# Patient Record
Sex: Female | Born: 1960 | Hispanic: No | Marital: Married | Smoking: Never smoker
Health system: Southern US, Community
[De-identification: ages and names within clinical notes are randomized; demographics above are authoritative.]

## PROBLEM LIST (undated history)

## (undated) DIAGNOSIS — D649 Anemia, unspecified: Secondary | ICD-10-CM

## (undated) DIAGNOSIS — I10 Essential (primary) hypertension: Secondary | ICD-10-CM

## (undated) DIAGNOSIS — K219 Gastro-esophageal reflux disease without esophagitis: Secondary | ICD-10-CM

## (undated) DIAGNOSIS — T7840XA Allergy, unspecified, initial encounter: Secondary | ICD-10-CM

## (undated) DIAGNOSIS — E119 Type 2 diabetes mellitus without complications: Secondary | ICD-10-CM

## (undated) DIAGNOSIS — E785 Hyperlipidemia, unspecified: Secondary | ICD-10-CM

## (undated) HISTORY — DX: Hyperlipidemia, unspecified: E78.5

## (undated) HISTORY — DX: Type 2 diabetes mellitus without complications: E11.9

## (undated) HISTORY — DX: Essential (primary) hypertension: I10

## (undated) HISTORY — DX: Anemia, unspecified: D64.9

## (undated) HISTORY — DX: Gastro-esophageal reflux disease without esophagitis: K21.9

## (undated) HISTORY — DX: Allergy, unspecified, initial encounter: T78.40XA

---

## 2005-07-05 ENCOUNTER — Ambulatory Visit (HOSPITAL_COMMUNITY): Admission: RE | Admit: 2005-07-05 | Discharge: 2005-07-05 | Payer: Self-pay | Admitting: Family Medicine

## 2007-02-09 ENCOUNTER — Other Ambulatory Visit: Admission: RE | Admit: 2007-02-09 | Discharge: 2007-02-09 | Payer: Self-pay | Admitting: Family Medicine

## 2008-12-05 ENCOUNTER — Encounter (INDEPENDENT_AMBULATORY_CARE_PROVIDER_SITE_OTHER): Payer: Self-pay | Admitting: Orthopedic Surgery

## 2008-12-05 ENCOUNTER — Ambulatory Visit (HOSPITAL_BASED_OUTPATIENT_CLINIC_OR_DEPARTMENT_OTHER): Admission: RE | Admit: 2008-12-05 | Discharge: 2008-12-05 | Payer: Self-pay | Admitting: Orthopedic Surgery

## 2010-09-10 ENCOUNTER — Ambulatory Visit (HOSPITAL_COMMUNITY)
Admission: RE | Admit: 2010-09-10 | Discharge: 2010-09-10 | Payer: Self-pay | Source: Home / Self Care | Attending: Obstetrics & Gynecology | Admitting: Obstetrics & Gynecology

## 2010-09-23 ENCOUNTER — Encounter
Admission: RE | Admit: 2010-09-23 | Discharge: 2010-09-23 | Payer: Self-pay | Source: Home / Self Care | Attending: Allergy | Admitting: Allergy

## 2010-12-23 LAB — POCT HEMOGLOBIN-HEMACUE: Hemoglobin: 14.7 g/dL (ref 12.0–15.0)

## 2011-01-25 NOTE — Op Note (Signed)
Bianca Robinson, Bianca Robinson                ACCOUNT NO.:  [id number]   MEDICAL RECORD NO.:  [id number]          PATIENT TYPE:  AMB   LOCATION:  DSC                          FACILITY:  [facility]   PHYSICIAN:  Matthew A. Weingold, M.D.DATE OF BIRTH:  03-Oct-1960   DATE OF PROCEDURE:  12/05/2008  DATE OF DISCHARGE:                               OPERATIVE REPORT   PREOPERATIVE DIAGNOSIS:  Right thumb thenar mass.   POSTOPERATIVE DIAGNOSIS:  Right thumb thenar mass.   PROCEDURE:  Excisional biopsy, right thumb thenar mass.   SURGEON:  Artist Pais. Mina Marble, MD   ASSISTANT:  None.   ANESTHESIA:  General.   TOURNIQUET TIME:  20 minutes.   COMPLICATIONS:  None.   DRAINS:  None.   SPECIMEN:  1 specimen sent.   OPERATIVE REPORT:  The patient was taken to the operating suite.  After  induction of adequate general anesthesia, the right upper extremity was  prepped and draped in sterile fashion.  Esmarch was used to exsanguinate  the limb tourniquet and inflated to 275 mmHg.  At this point in time,  longitudinal incision was made midline of the thenar eminence to the MP  flexion crease and the base thumb metacarpal.  Skin was incised 3-4 cm.  Dissection was carried down to a mass that appeared to be consistent  with a probable hemangioma.  This was dissected down to the level of the  neurovascular bundles, both of which were identified and retracted.  The  mass was removed from its entirety off the fascia overlying the thenar  muscles and was sent to pathologic confirmation.  The wound was  irrigated.  Hemostasis was achieved with electrocautery.  It was loosely  closed with 3-0 Prolene subcuticular stitch.  Steri-Strips, 4x4s,  fluffs, and compressed dressing was applied.  The patient tolerated the  procedure well and taken to recovery in stable fashion.      Artist Pais Mina Marble, M.D.  Electronically Signed     MAW/MEDQ  D:  12/05/2008  T:  12/05/2008  Job:  [id number]

## 2012-02-16 ENCOUNTER — Other Ambulatory Visit: Payer: Self-pay | Admitting: Obstetrics & Gynecology

## 2012-02-16 DIAGNOSIS — Z1231 Encounter for screening mammogram for malignant neoplasm of breast: Secondary | ICD-10-CM

## 2012-03-12 ENCOUNTER — Ambulatory Visit (HOSPITAL_COMMUNITY)
Admission: RE | Admit: 2012-03-12 | Discharge: 2012-03-12 | Disposition: A | Discharge status: Home / Self Care | Payer: Commercial Managed Care - PPO | Source: Ambulatory Visit | Attending: Obstetrics & Gynecology | Admitting: Obstetrics & Gynecology

## 2012-03-12 DIAGNOSIS — Z1231 Encounter for screening mammogram for malignant neoplasm of breast: Secondary | ICD-10-CM

## 2012-09-12 HISTORY — PX: HAND SURGERY: SHX662

## 2014-04-09 ENCOUNTER — Encounter (HOSPITAL_COMMUNITY): Payer: Self-pay | Admitting: Emergency Medicine

## 2014-04-09 ENCOUNTER — Emergency Department (HOSPITAL_COMMUNITY)
Admission: EM | Admit: 2014-04-09 | Discharge: 2014-04-09 | Disposition: A | Discharge status: Home / Self Care | Payer: 59 | Attending: Emergency Medicine | Admitting: Emergency Medicine

## 2014-04-09 ENCOUNTER — Emergency Department (HOSPITAL_COMMUNITY): Payer: 59

## 2014-04-09 DIAGNOSIS — S8990XA Unspecified injury of unspecified lower leg, initial encounter: Secondary | ICD-10-CM | POA: Insufficient documentation

## 2014-04-09 DIAGNOSIS — W010XXA Fall on same level from slipping, tripping and stumbling without subsequent striking against object, initial encounter: Secondary | ICD-10-CM | POA: Insufficient documentation

## 2014-04-09 DIAGNOSIS — IMO0002 Reserved for concepts with insufficient information to code with codable children: Secondary | ICD-10-CM | POA: Insufficient documentation

## 2014-04-09 DIAGNOSIS — S8391XA Sprain of unspecified site of right knee, initial encounter: Secondary | ICD-10-CM

## 2014-04-09 DIAGNOSIS — S93409A Sprain of unspecified ligament of unspecified ankle, initial encounter: Secondary | ICD-10-CM | POA: Insufficient documentation

## 2014-04-09 DIAGNOSIS — S99929A Unspecified injury of unspecified foot, initial encounter: Secondary | ICD-10-CM

## 2014-04-09 DIAGNOSIS — S99919A Unspecified injury of unspecified ankle, initial encounter: Secondary | ICD-10-CM

## 2014-04-09 DIAGNOSIS — Y9301 Activity, walking, marching and hiking: Secondary | ICD-10-CM | POA: Insufficient documentation

## 2014-04-09 DIAGNOSIS — Y929 Unspecified place or not applicable: Secondary | ICD-10-CM | POA: Insufficient documentation

## 2014-04-09 DIAGNOSIS — S93401A Sprain of unspecified ligament of right ankle, initial encounter: Secondary | ICD-10-CM

## 2014-04-09 DIAGNOSIS — Z791 Long term (current) use of non-steroidal anti-inflammatories (NSAID): Secondary | ICD-10-CM | POA: Insufficient documentation

## 2014-04-09 MED ORDER — NAPROXEN 500 MG PO TABS: 500.0000 mg | ORAL_TABLET | Freq: Two times a day (BID) | ORAL | Status: AC

## 2014-04-09 MED ORDER — HYDROCODONE-ACETAMINOPHEN 5-325 MG PO TABS
2.0000 | ORAL_TABLET | Freq: Once | ORAL | Status: AC
  Administered 2014-04-09: 2 via ORAL
  Filled 2014-04-09: qty 2

## 2014-04-09 MED ORDER — ONDANSETRON HCL 4 MG/2ML IJ SOLN
4.0000 mg | Freq: Once | INTRAMUSCULAR | Status: AC
Start: 2014-04-09 — End: 2014-04-09
  Administered 2014-04-09: 4 mg via INTRAVENOUS

## 2014-04-09 MED ORDER — HYDROCODONE-ACETAMINOPHEN 5-325 MG PO TABS: 2.0000 | ORAL_TABLET | ORAL | Status: AC | PRN

## 2014-04-09 MED ORDER — FENTANYL CITRATE 0.05 MG/ML IJ SOLN
50.0000 ug | Freq: Once | INTRAMUSCULAR | Status: AC
  Administered 2014-04-09: 50 ug via INTRAVENOUS
  Filled 2014-04-09: qty 2

## 2014-04-09 MED ORDER — DOCUSATE SODIUM 100 MG PO CAPS: 100.0000 mg | ORAL_CAPSULE | Freq: Two times a day (BID) | ORAL | Status: AC

## 2014-04-09 MED ORDER — ONDANSETRON HCL 4 MG/2ML IJ SOLN
INTRAMUSCULAR | Status: AC
  Administered 2014-04-09: 4 mg via INTRAVENOUS
  Filled 2014-04-09: qty 2

## 2014-04-09 NOTE — ED Notes (Signed)
c-collar and LSB cleared by Dr. Sabra Heck.  Pt A+Ox4, actively vomiting.  +bruising to R knee, pt c/o pain to RLE.  Skin otherwise PWD.  No obvious deformities.  Family at bedside.  NAD.

## 2014-04-09 NOTE — ED Notes (Signed)
Pt transported to Xray. 

## 2014-04-09 NOTE — ED Provider Notes (Signed)
CSN: [id number]     Arrival date & time 04/09/14  1837 History   First MD Initiated Contact with Patient 04/09/14 1847     Chief Complaint  Patient presents with  . Fall  . Leg Pain     (Consider location/radiation/quality/duration/timing/severity/associated sxs/prior Treatment) HPI Comments: 53 year old female with no significant past medical history presents after having a mechanical fall when she tripped over an uneven piece of concrete per the patient's report. She ended up landing on her right side including her lower extremities. She was unable to get off the ground because of ongoing pain in her mid thigh, knee and right lower extremity. She denies head injury or neck pain and has no weakness or numbness. The patient was immobilized with a backboard and cervical collar prior to arrival. The patient denies head injury. No medications given prior to arrival. The patient did have some hyperventilating during which time she had some tingling in her fingers and lips. This occurred just prior to arrival, it was acute in onset, the symptoms are persistent, worse with manipulation of the right lower extremity.  Patient is a 53 y.o. female presenting with fall and leg pain. The history is provided by the patient, the spouse and the EMS personnel.  Fall  Leg Pain   History reviewed. No pertinent past medical history. No past surgical history on file. No family history on file. History  Substance Use Topics  . Smoking status: Not on file  . Smokeless tobacco: Not on file  . Alcohol Use: Not on file   OB History   Grav Para Term Preterm Abortions TAB SAB Ect Mult Living                 Review of Systems  All other systems reviewed and are negative.     Allergies  Review of patient's allergies indicates no known allergies.  Home Medications   Prior to Admission medications   Medication Sig Start Date End Date Taking? Authorizing Provider  docusate sodium (COLACE) 100 MG  capsule Take 1 capsule (100 mg total) by mouth every 12 (twelve) hours. 04/09/14   Johnna Acosta, MD  HYDROcodone-acetaminophen (NORCO/VICODIN) 5-325 MG per tablet Take 2 tablets by mouth every 4 (four) hours as needed. 04/09/14   Johnna Acosta, MD  naproxen (NAPROSYN) 500 MG tablet Take 1 tablet (500 mg total) by mouth 2 (two) times daily with a meal. 04/09/14   Johnna Acosta, MD   BP 142/109  Pulse 99  Temp(Src) 98.3 F (36.8 C) (Oral)  Resp 16  SpO2 100%  LMP 03/10/2014 Physical Exam  Nursing note and vitals reviewed. Constitutional: She appears well-developed and well-nourished.  Uncomfortable appearing  HENT:  Head: Normocephalic and atraumatic.  Mouth/Throat: Oropharynx is clear and moist. No oropharyngeal exudate.  Eyes: Conjunctivae and EOM are normal. Pupils are equal, round, and reactive to light. Right eye exhibits no discharge. Left eye exhibits no discharge. No scleral icterus.  Neck: Normal range of motion. Neck supple. No JVD present. No thyromegaly present.  Cardiovascular: Normal rate, regular rhythm, normal heart sounds and intact distal pulses.  Exam reveals no gallop and no friction rub.   No murmur heard. Pulmonary/Chest: Effort normal and breath sounds normal. No respiratory distress. She has no wheezes. She has no rales.  Abdominal: Soft. Bowel sounds are normal. She exhibits no distension and no mass. There is no tenderness.  Musculoskeletal: Normal range of motion. She exhibits tenderness ( ttp in the mid thigh,  the knee and the RLE on the R.  mild bruising distal to the knee anteriorly, dec ROM of the knee and ankle on the R, normal hipi exam). She exhibits no edema.  Lymphadenopathy:    She has no cervical adenopathy.  Neurological: She is alert. Coordination normal.  Normal strenght and sensation to all 4 extremities - won't raise RLE off bed 2/2 pain  Skin: Skin is warm and dry.  Psychiatric: She has a normal mood and affect. Her behavior is normal.    ED  Course  Procedures (including critical care time) Labs Review Labs Reviewed - No data to display  Imaging Review Dg Femur Right  04/09/2014   CLINICAL DATA:  Fall today with right hip pain and ankle pain.  EXAM: RIGHT KNEE - COMPLETE 4+ VIEW; RIGHT ANKLE - COMPLETE 3+ VIEW; RIGHT FEMUR - 2 VIEW; RIGHT TIBIA AND FIBULA - 2 VIEW  COMPARISON:  None.  FINDINGS: Four views right knee: No acute fracture or dislocation. 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 No joint effusion. Mild enthesopathic change at the quadriceps insertion.  Four views of the right femur. Mild weight-bearing surface joint space narrowing about the hip. Nutrient foramen within the posterior mid femoral shaft. Degraded cross-table lateral view secondary to overlying soft tissues.  Four views of the right tibia/ fibula: No acute fracture or dislocation.  Three views of the right ankle: Bimalleolar soft tissue swelling. No acute fracture or dislocation. Base of fifth metatarsal and talar dome intact. Lateral view is suboptimal secondary to obliquity and cross-table technique. Apparent irregularity about the posterior aspect of the distal fibula and talus on the lateral view is favored to be due to an accessory ossicle. Small Achilles and calcaneal spurs.  IMPRESSION: Soft tissue swelling about the ankle. Suboptimal lateral view. Favor accessory ossicle about the posterior aspect of the talus and distal fibula. If symptoms in this area, consider repeat with standard lateral view.  Otherwise, degenerative change without acute osseous finding throughout the remainder of the right lower extremity.   Electronically Signed   By: Abigail Miyamoto M.D.   On: 04/09/2014 19:31   Dg Tibia/fibula Right  04/09/2014   CLINICAL DATA:  Fall today with right hip pain and ankle pain.  EXAM: RIGHT KNEE - COMPLETE 4+ VIEW; RIGHT ANKLE - COMPLETE 3+ VIEW; RIGHT FEMUR - 2 VIEW; RIGHT TIBIA AND FIBULA - 2 VIEW  COMPARISON:  None.  FINDINGS: Four  views right knee: No acute fracture or dislocation. 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 No joint effusion. Mild enthesopathic change at the quadriceps insertion.  Four views of the right femur. Mild weight-bearing surface joint space narrowing about the hip. Nutrient foramen within the posterior mid femoral shaft. Degraded cross-table lateral view secondary to overlying soft tissues.  Four views of the right tibia/ fibula: No acute fracture or dislocation.  Three views of the right ankle: Bimalleolar soft tissue swelling. No acute fracture or dislocation. Base of fifth metatarsal and talar dome intact. Lateral view is suboptimal secondary to obliquity and cross-table technique. Apparent irregularity about the posterior aspect of the distal fibula and talus on the lateral view is favored to be due to an accessory ossicle. Small Achilles and calcaneal spurs.  IMPRESSION: Soft tissue swelling about the ankle. Suboptimal lateral view. Favor accessory ossicle about the posterior aspect of the talus and distal fibula. If symptoms in this area, consider repeat with standard lateral view.  Otherwise, degenerative change without acute osseous finding throughout the  remainder of the right lower extremity.   Electronically Signed   By: Abigail Miyamoto M.D.   On: 04/09/2014 19:31   Dg Ankle Complete Right  04/09/2014   CLINICAL DATA:  Fall today with right hip pain and ankle pain.  EXAM: RIGHT KNEE - COMPLETE 4+ VIEW; RIGHT ANKLE - COMPLETE 3+ VIEW; RIGHT FEMUR - 2 VIEW; RIGHT TIBIA AND FIBULA - 2 VIEW  COMPARISON:  None.  FINDINGS: Four views right knee: No acute fracture or dislocation. 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 No joint effusion. Mild enthesopathic change at the quadriceps insertion.  Four views of the right femur. Mild weight-bearing surface joint space narrowing about the hip. Nutrient foramen within the posterior mid femoral shaft. Degraded cross-table lateral  view secondary to overlying soft tissues.  Four views of the right tibia/ fibula: No acute fracture or dislocation.  Three views of the right ankle: Bimalleolar soft tissue swelling. No acute fracture or dislocation. Base of fifth metatarsal and talar dome intact. Lateral view is suboptimal secondary to obliquity and cross-table technique. Apparent irregularity about the posterior aspect of the distal fibula and talus on the lateral view is favored to be due to an accessory ossicle. Small Achilles and calcaneal spurs.  IMPRESSION: Soft tissue swelling about the ankle. Suboptimal lateral view. Favor accessory ossicle about the posterior aspect of the talus and distal fibula. If symptoms in this area, consider repeat with standard lateral view.  Otherwise, degenerative change without acute osseous finding throughout the remainder of the right lower extremity.   Electronically Signed   By: Abigail Miyamoto M.D.   On: 04/09/2014 19:31   Dg Knee Complete 4 Views Right  04/09/2014   CLINICAL DATA:  Fall today with right hip pain and ankle pain.  EXAM: RIGHT KNEE - COMPLETE 4+ VIEW; RIGHT ANKLE - COMPLETE 3+ VIEW; RIGHT FEMUR - 2 VIEW; RIGHT TIBIA AND FIBULA - 2 VIEW  COMPARISON:  None.  FINDINGS: Four views right knee: No acute fracture or dislocation. 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 No joint effusion. Mild enthesopathic change at the quadriceps insertion.  Four views of the right femur. Mild weight-bearing surface joint space narrowing about the hip. Nutrient foramen within the posterior mid femoral shaft. Degraded cross-table lateral view secondary to overlying soft tissues.  Four views of the right tibia/ fibula: No acute fracture or dislocation.  Three views of the right ankle: Bimalleolar soft tissue swelling. No acute fracture or dislocation. Base of fifth metatarsal and talar dome intact. Lateral view is suboptimal secondary to obliquity and cross-table technique. Apparent irregularity  about the posterior aspect of the distal fibula and talus on the lateral view is favored to be due to an accessory ossicle. Small Achilles and calcaneal spurs.  IMPRESSION: Soft tissue swelling about the ankle. Suboptimal lateral view. Favor accessory ossicle about the posterior aspect of the talus and distal fibula. If symptoms in this area, consider repeat with standard lateral view.  Otherwise, degenerative change without acute osseous finding throughout the remainder of the right lower extremity.   Electronically Signed   By: Abigail Miyamoto M.D.   On: 04/09/2014 19:31      MDM   Final diagnoses:  Sprain of right knee, initial encounter  Sprain of right ankle, initial encounter    Imaging ordered, pain meds ordered, reevaluate.  No head injury, back pain or CP / SOB / Abd pain or vomiting - spouse presents and states she occasionally has a coughing fit (feels  like she has something in her throat) when she has had pain - similar to past.  Doubt primary lung or neck abnormality.  xrays neg - recommended f/u with Ortho in 7-10 days - crutches, RICE, and f/u PRN for repeat knee exam when swelling and pain down.  Pt in agreement.  Meds given in ED:  Medications  HYDROcodone-acetaminophen (NORCO/VICODIN) 5-325 MG per tablet 2 tablet (not administered)  fentaNYL (SUBLIMAZE) injection 50 mcg (50 mcg Intravenous Given 04/09/14 1909)  ondansetron (ZOFRAN) injection 4 mg (4 mg Intravenous Given 04/09/14 1855)    New Prescriptions   DOCUSATE SODIUM (COLACE) 100 MG CAPSULE    Take 1 capsule (100 mg total) by mouth every 12 (twelve) hours.   HYDROCODONE-ACETAMINOPHEN (NORCO/VICODIN) 5-325 MG PER TABLET    Take 2 tablets by mouth every 4 (four) hours as needed.   NAPROXEN (NAPROSYN) 500 MG TABLET    Take 1 tablet (500 mg total) by mouth 2 (two) times daily with a meal.      Johnna Acosta, MD 04/09/14 [phone]

## 2014-04-09 NOTE — ED Notes (Signed)
Bed: WA01 Expected date:  Expected time:  Means of arrival:  Comments: EMS- 53yo F, leg pain after fall, anxiety

## 2014-04-09 NOTE — ED Notes (Signed)
Pt was walking up stairs at [facility] and fell landing on her R side. States she now has pain in R hip and leg. Lower back pain. Anxiety. Tingling in fingers and lips after hyperventilating. Alert and oriented. No LOC.

## 2015-01-13 ENCOUNTER — Other Ambulatory Visit: Payer: Self-pay | Admitting: Physician Assistant

## 2015-01-13 ENCOUNTER — Other Ambulatory Visit (HOSPITAL_COMMUNITY)
Admission: RE | Admit: 2015-01-13 | Discharge: 2015-01-13 | Disposition: A | Discharge status: Home / Self Care | Payer: 59 | Source: Ambulatory Visit | Attending: Physician Assistant | Admitting: Physician Assistant

## 2015-01-13 DIAGNOSIS — Z01419 Encounter for gynecological examination (general) (routine) without abnormal findings: Secondary | ICD-10-CM | POA: Diagnosis present

## 2015-01-13 DIAGNOSIS — Z1151 Encounter for screening for human papillomavirus (HPV): Secondary | ICD-10-CM | POA: Insufficient documentation

## 2015-01-14 LAB — CYTOLOGY - PAP

## 2015-03-12 ENCOUNTER — Encounter: Payer: Self-pay | Admitting: Physician Assistant

## 2015-06-19 ENCOUNTER — Encounter: Payer: Self-pay | Admitting: Physician Assistant

## 2015-09-15 MED FILL — AMLODIPINE BESYLATE 10 MG T: 10 | 30 days supply | Qty: 30 | Fill #1

## 2015-09-16 DIAGNOSIS — J301 Allergic rhinitis due to pollen: Secondary | ICD-10-CM | POA: Diagnosis not present

## 2015-09-16 DIAGNOSIS — J3089 Other allergic rhinitis: Secondary | ICD-10-CM | POA: Diagnosis not present

## 2015-09-23 DIAGNOSIS — J3089 Other allergic rhinitis: Secondary | ICD-10-CM | POA: Diagnosis not present

## 2015-09-23 DIAGNOSIS — J301 Allergic rhinitis due to pollen: Secondary | ICD-10-CM | POA: Diagnosis not present

## 2015-09-24 DIAGNOSIS — K644 Residual hemorrhoidal skin tags: Secondary | ICD-10-CM | POA: Diagnosis not present

## 2015-09-24 DIAGNOSIS — R232 Flushing: Secondary | ICD-10-CM | POA: Diagnosis not present

## 2015-09-24 DIAGNOSIS — M79601 Pain in right arm: Secondary | ICD-10-CM | POA: Diagnosis not present

## 2015-09-24 DIAGNOSIS — I10 Essential (primary) hypertension: Secondary | ICD-10-CM | POA: Diagnosis not present

## 2015-09-30 DIAGNOSIS — J3089 Other allergic rhinitis: Secondary | ICD-10-CM | POA: Diagnosis not present

## 2015-09-30 DIAGNOSIS — J301 Allergic rhinitis due to pollen: Secondary | ICD-10-CM | POA: Diagnosis not present

## 2015-10-06 MED FILL — HYDROCORT-PRAMOXINE 2.5%-1%: 2.5-1 | 30 days supply | Qty: 120 | Fill #0

## 2015-10-06 MED FILL — AMLODIPINE BESYLATE 10 MG T: 10 | 90 days supply | Qty: 90 | Fill #0

## 2015-10-07 DIAGNOSIS — J3089 Other allergic rhinitis: Secondary | ICD-10-CM | POA: Diagnosis not present

## 2015-10-07 DIAGNOSIS — J301 Allergic rhinitis due to pollen: Secondary | ICD-10-CM | POA: Diagnosis not present

## 2015-10-14 DIAGNOSIS — J3089 Other allergic rhinitis: Secondary | ICD-10-CM | POA: Diagnosis not present

## 2015-10-14 DIAGNOSIS — J301 Allergic rhinitis due to pollen: Secondary | ICD-10-CM | POA: Diagnosis not present

## 2015-10-29 DIAGNOSIS — J301 Allergic rhinitis due to pollen: Secondary | ICD-10-CM | POA: Diagnosis not present

## 2015-10-29 DIAGNOSIS — J3089 Other allergic rhinitis: Secondary | ICD-10-CM | POA: Diagnosis not present

## 2015-11-04 DIAGNOSIS — J301 Allergic rhinitis due to pollen: Secondary | ICD-10-CM | POA: Diagnosis not present

## 2015-11-04 DIAGNOSIS — J3089 Other allergic rhinitis: Secondary | ICD-10-CM | POA: Diagnosis not present

## 2015-11-11 DIAGNOSIS — J3089 Other allergic rhinitis: Secondary | ICD-10-CM | POA: Diagnosis not present

## 2015-11-11 DIAGNOSIS — J301 Allergic rhinitis due to pollen: Secondary | ICD-10-CM | POA: Diagnosis not present

## 2015-11-18 DIAGNOSIS — J301 Allergic rhinitis due to pollen: Secondary | ICD-10-CM | POA: Diagnosis not present

## 2015-11-18 DIAGNOSIS — J3089 Other allergic rhinitis: Secondary | ICD-10-CM | POA: Diagnosis not present

## 2015-11-25 DIAGNOSIS — J301 Allergic rhinitis due to pollen: Secondary | ICD-10-CM | POA: Diagnosis not present

## 2015-11-25 DIAGNOSIS — J3089 Other allergic rhinitis: Secondary | ICD-10-CM | POA: Diagnosis not present

## 2015-12-02 DIAGNOSIS — J3089 Other allergic rhinitis: Secondary | ICD-10-CM | POA: Diagnosis not present

## 2015-12-02 DIAGNOSIS — J301 Allergic rhinitis due to pollen: Secondary | ICD-10-CM | POA: Diagnosis not present

## 2015-12-09 DIAGNOSIS — J301 Allergic rhinitis due to pollen: Secondary | ICD-10-CM | POA: Diagnosis not present

## 2015-12-09 DIAGNOSIS — J3089 Other allergic rhinitis: Secondary | ICD-10-CM | POA: Diagnosis not present

## 2015-12-16 DIAGNOSIS — J301 Allergic rhinitis due to pollen: Secondary | ICD-10-CM | POA: Diagnosis not present

## 2015-12-16 DIAGNOSIS — J3089 Other allergic rhinitis: Secondary | ICD-10-CM | POA: Diagnosis not present

## 2015-12-23 DIAGNOSIS — J301 Allergic rhinitis due to pollen: Secondary | ICD-10-CM | POA: Diagnosis not present

## 2015-12-23 DIAGNOSIS — J3089 Other allergic rhinitis: Secondary | ICD-10-CM | POA: Diagnosis not present

## 2015-12-30 DIAGNOSIS — J301 Allergic rhinitis due to pollen: Secondary | ICD-10-CM | POA: Diagnosis not present

## 2015-12-30 DIAGNOSIS — J3089 Other allergic rhinitis: Secondary | ICD-10-CM | POA: Diagnosis not present

## 2016-01-06 DIAGNOSIS — J3089 Other allergic rhinitis: Secondary | ICD-10-CM | POA: Diagnosis not present

## 2016-01-06 DIAGNOSIS — J301 Allergic rhinitis due to pollen: Secondary | ICD-10-CM | POA: Diagnosis not present

## 2016-01-12 MED FILL — AMLODIPINE BESYLATE 10 MG T: 10 | 90 days supply | Qty: 90 | Fill #1

## 2016-01-13 DIAGNOSIS — J3089 Other allergic rhinitis: Secondary | ICD-10-CM | POA: Diagnosis not present

## 2016-01-13 DIAGNOSIS — J301 Allergic rhinitis due to pollen: Secondary | ICD-10-CM | POA: Diagnosis not present

## 2016-01-20 DIAGNOSIS — J3089 Other allergic rhinitis: Secondary | ICD-10-CM | POA: Diagnosis not present

## 2016-01-20 DIAGNOSIS — J301 Allergic rhinitis due to pollen: Secondary | ICD-10-CM | POA: Diagnosis not present

## 2016-01-25 DIAGNOSIS — J301 Allergic rhinitis due to pollen: Secondary | ICD-10-CM | POA: Diagnosis not present

## 2016-01-25 DIAGNOSIS — J3089 Other allergic rhinitis: Secondary | ICD-10-CM | POA: Diagnosis not present

## 2016-01-27 DIAGNOSIS — J301 Allergic rhinitis due to pollen: Secondary | ICD-10-CM | POA: Diagnosis not present

## 2016-01-27 DIAGNOSIS — J3089 Other allergic rhinitis: Secondary | ICD-10-CM | POA: Diagnosis not present

## 2016-02-03 DIAGNOSIS — J301 Allergic rhinitis due to pollen: Secondary | ICD-10-CM | POA: Diagnosis not present

## 2016-02-03 DIAGNOSIS — J3089 Other allergic rhinitis: Secondary | ICD-10-CM | POA: Diagnosis not present

## 2016-02-05 DIAGNOSIS — J301 Allergic rhinitis due to pollen: Secondary | ICD-10-CM | POA: Diagnosis not present

## 2016-02-05 DIAGNOSIS — J3089 Other allergic rhinitis: Secondary | ICD-10-CM | POA: Diagnosis not present

## 2016-02-10 DIAGNOSIS — J301 Allergic rhinitis due to pollen: Secondary | ICD-10-CM | POA: Diagnosis not present

## 2016-02-10 DIAGNOSIS — J3089 Other allergic rhinitis: Secondary | ICD-10-CM | POA: Diagnosis not present

## 2016-02-12 DIAGNOSIS — J301 Allergic rhinitis due to pollen: Secondary | ICD-10-CM | POA: Diagnosis not present

## 2016-02-12 DIAGNOSIS — J3089 Other allergic rhinitis: Secondary | ICD-10-CM | POA: Diagnosis not present

## 2016-02-17 DIAGNOSIS — J3089 Other allergic rhinitis: Secondary | ICD-10-CM | POA: Diagnosis not present

## 2016-02-17 DIAGNOSIS — J301 Allergic rhinitis due to pollen: Secondary | ICD-10-CM | POA: Diagnosis not present

## 2016-02-24 DIAGNOSIS — J3089 Other allergic rhinitis: Secondary | ICD-10-CM | POA: Diagnosis not present

## 2016-02-24 DIAGNOSIS — J301 Allergic rhinitis due to pollen: Secondary | ICD-10-CM | POA: Diagnosis not present

## 2016-03-02 DIAGNOSIS — J301 Allergic rhinitis due to pollen: Secondary | ICD-10-CM | POA: Diagnosis not present

## 2016-03-02 DIAGNOSIS — J3089 Other allergic rhinitis: Secondary | ICD-10-CM | POA: Diagnosis not present

## 2016-03-09 DIAGNOSIS — J301 Allergic rhinitis due to pollen: Secondary | ICD-10-CM | POA: Diagnosis not present

## 2016-03-09 DIAGNOSIS — J3089 Other allergic rhinitis: Secondary | ICD-10-CM | POA: Diagnosis not present

## 2016-03-16 DIAGNOSIS — J301 Allergic rhinitis due to pollen: Secondary | ICD-10-CM | POA: Diagnosis not present

## 2016-03-16 DIAGNOSIS — J3089 Other allergic rhinitis: Secondary | ICD-10-CM | POA: Diagnosis not present

## 2016-03-23 DIAGNOSIS — I10 Essential (primary) hypertension: Secondary | ICD-10-CM | POA: Diagnosis not present

## 2016-03-23 DIAGNOSIS — J301 Allergic rhinitis due to pollen: Secondary | ICD-10-CM | POA: Diagnosis not present

## 2016-03-23 DIAGNOSIS — J3089 Other allergic rhinitis: Secondary | ICD-10-CM | POA: Diagnosis not present

## 2016-03-30 DIAGNOSIS — J301 Allergic rhinitis due to pollen: Secondary | ICD-10-CM | POA: Diagnosis not present

## 2016-03-30 DIAGNOSIS — J3089 Other allergic rhinitis: Secondary | ICD-10-CM | POA: Diagnosis not present

## 2016-04-06 DIAGNOSIS — J301 Allergic rhinitis due to pollen: Secondary | ICD-10-CM | POA: Diagnosis not present

## 2016-04-06 DIAGNOSIS — J3089 Other allergic rhinitis: Secondary | ICD-10-CM | POA: Diagnosis not present

## 2016-04-06 MED FILL — AMLODIPINE BESYLATE 5 MG TA: 5 | 90 days supply | Qty: 90 | Fill #0

## 2016-04-06 MED FILL — HYDROCHLOROTHIAZIDE 12.5 MG: 12.5 | 90 days supply | Qty: 90 | Fill #0

## 2016-04-15 DIAGNOSIS — J3089 Other allergic rhinitis: Secondary | ICD-10-CM | POA: Diagnosis not present

## 2016-04-15 DIAGNOSIS — J301 Allergic rhinitis due to pollen: Secondary | ICD-10-CM | POA: Diagnosis not present

## 2016-04-26 DIAGNOSIS — J301 Allergic rhinitis due to pollen: Secondary | ICD-10-CM | POA: Diagnosis not present

## 2016-04-26 DIAGNOSIS — J3089 Other allergic rhinitis: Secondary | ICD-10-CM | POA: Diagnosis not present

## 2016-04-29 DIAGNOSIS — J3089 Other allergic rhinitis: Secondary | ICD-10-CM | POA: Diagnosis not present

## 2016-04-29 DIAGNOSIS — J301 Allergic rhinitis due to pollen: Secondary | ICD-10-CM | POA: Diagnosis not present

## 2016-05-04 DIAGNOSIS — J3089 Other allergic rhinitis: Secondary | ICD-10-CM | POA: Diagnosis not present

## 2016-05-04 DIAGNOSIS — J301 Allergic rhinitis due to pollen: Secondary | ICD-10-CM | POA: Diagnosis not present

## 2016-05-06 DIAGNOSIS — J3089 Other allergic rhinitis: Secondary | ICD-10-CM | POA: Diagnosis not present

## 2016-05-06 DIAGNOSIS — J301 Allergic rhinitis due to pollen: Secondary | ICD-10-CM | POA: Diagnosis not present

## 2016-05-13 DIAGNOSIS — J301 Allergic rhinitis due to pollen: Secondary | ICD-10-CM | POA: Diagnosis not present

## 2016-05-13 DIAGNOSIS — J3089 Other allergic rhinitis: Secondary | ICD-10-CM | POA: Diagnosis not present

## 2016-05-20 DIAGNOSIS — J301 Allergic rhinitis due to pollen: Secondary | ICD-10-CM | POA: Diagnosis not present

## 2016-05-20 DIAGNOSIS — J3089 Other allergic rhinitis: Secondary | ICD-10-CM | POA: Diagnosis not present

## 2016-05-27 DIAGNOSIS — J301 Allergic rhinitis due to pollen: Secondary | ICD-10-CM | POA: Diagnosis not present

## 2016-05-27 DIAGNOSIS — J3089 Other allergic rhinitis: Secondary | ICD-10-CM | POA: Diagnosis not present

## 2016-06-03 DIAGNOSIS — J301 Allergic rhinitis due to pollen: Secondary | ICD-10-CM | POA: Diagnosis not present

## 2016-06-03 DIAGNOSIS — J3089 Other allergic rhinitis: Secondary | ICD-10-CM | POA: Diagnosis not present

## 2016-06-09 DIAGNOSIS — J301 Allergic rhinitis due to pollen: Secondary | ICD-10-CM | POA: Diagnosis not present

## 2016-06-09 DIAGNOSIS — J3089 Other allergic rhinitis: Secondary | ICD-10-CM | POA: Diagnosis not present

## 2016-06-10 DIAGNOSIS — J301 Allergic rhinitis due to pollen: Secondary | ICD-10-CM | POA: Diagnosis not present

## 2016-06-10 DIAGNOSIS — J3089 Other allergic rhinitis: Secondary | ICD-10-CM | POA: Diagnosis not present

## 2016-06-17 DIAGNOSIS — J301 Allergic rhinitis due to pollen: Secondary | ICD-10-CM | POA: Diagnosis not present

## 2016-06-17 DIAGNOSIS — J3089 Other allergic rhinitis: Secondary | ICD-10-CM | POA: Diagnosis not present

## 2016-06-24 DIAGNOSIS — J301 Allergic rhinitis due to pollen: Secondary | ICD-10-CM | POA: Diagnosis not present

## 2016-06-24 DIAGNOSIS — J3089 Other allergic rhinitis: Secondary | ICD-10-CM | POA: Diagnosis not present

## 2016-07-01 DIAGNOSIS — J301 Allergic rhinitis due to pollen: Secondary | ICD-10-CM | POA: Diagnosis not present

## 2016-07-01 DIAGNOSIS — J3089 Other allergic rhinitis: Secondary | ICD-10-CM | POA: Diagnosis not present

## 2016-07-08 DIAGNOSIS — J3089 Other allergic rhinitis: Secondary | ICD-10-CM | POA: Diagnosis not present

## 2016-07-08 DIAGNOSIS — J301 Allergic rhinitis due to pollen: Secondary | ICD-10-CM | POA: Diagnosis not present

## 2016-07-08 MED FILL — AMLODIPINE BESYLATE 5 MG TA: 5 | 90 days supply | Qty: 90 | Fill #1

## 2016-07-08 MED FILL — HYDROCHLOROTHIAZIDE 12.5 MG: 12.5 | 90 days supply | Qty: 90 | Fill #1

## 2016-07-13 DIAGNOSIS — J301 Allergic rhinitis due to pollen: Secondary | ICD-10-CM | POA: Diagnosis not present

## 2016-07-13 DIAGNOSIS — J3089 Other allergic rhinitis: Secondary | ICD-10-CM | POA: Diagnosis not present

## 2016-07-15 DIAGNOSIS — J301 Allergic rhinitis due to pollen: Secondary | ICD-10-CM | POA: Diagnosis not present

## 2016-07-15 DIAGNOSIS — J3089 Other allergic rhinitis: Secondary | ICD-10-CM | POA: Diagnosis not present

## 2016-07-20 DIAGNOSIS — J3089 Other allergic rhinitis: Secondary | ICD-10-CM | POA: Diagnosis not present

## 2016-07-20 DIAGNOSIS — J301 Allergic rhinitis due to pollen: Secondary | ICD-10-CM | POA: Diagnosis not present

## 2016-07-22 DIAGNOSIS — J301 Allergic rhinitis due to pollen: Secondary | ICD-10-CM | POA: Diagnosis not present

## 2016-07-22 DIAGNOSIS — J3089 Other allergic rhinitis: Secondary | ICD-10-CM | POA: Diagnosis not present

## 2016-07-29 DIAGNOSIS — J3089 Other allergic rhinitis: Secondary | ICD-10-CM | POA: Diagnosis not present

## 2016-07-29 DIAGNOSIS — J301 Allergic rhinitis due to pollen: Secondary | ICD-10-CM | POA: Diagnosis not present

## 2016-08-03 DIAGNOSIS — J301 Allergic rhinitis due to pollen: Secondary | ICD-10-CM | POA: Diagnosis not present

## 2016-08-03 DIAGNOSIS — J3089 Other allergic rhinitis: Secondary | ICD-10-CM | POA: Diagnosis not present

## 2016-08-12 DIAGNOSIS — J3089 Other allergic rhinitis: Secondary | ICD-10-CM | POA: Diagnosis not present

## 2016-08-12 DIAGNOSIS — J301 Allergic rhinitis due to pollen: Secondary | ICD-10-CM | POA: Diagnosis not present

## 2016-08-17 DIAGNOSIS — H1045 Other chronic allergic conjunctivitis: Secondary | ICD-10-CM | POA: Diagnosis not present

## 2016-08-17 DIAGNOSIS — J3089 Other allergic rhinitis: Secondary | ICD-10-CM | POA: Diagnosis not present

## 2016-08-17 DIAGNOSIS — J301 Allergic rhinitis due to pollen: Secondary | ICD-10-CM | POA: Diagnosis not present

## 2016-08-26 DIAGNOSIS — J301 Allergic rhinitis due to pollen: Secondary | ICD-10-CM | POA: Diagnosis not present

## 2016-08-26 DIAGNOSIS — J3089 Other allergic rhinitis: Secondary | ICD-10-CM | POA: Diagnosis not present

## 2016-09-02 DIAGNOSIS — J3089 Other allergic rhinitis: Secondary | ICD-10-CM | POA: Diagnosis not present

## 2016-09-02 DIAGNOSIS — J301 Allergic rhinitis due to pollen: Secondary | ICD-10-CM | POA: Diagnosis not present

## 2016-09-09 DIAGNOSIS — J301 Allergic rhinitis due to pollen: Secondary | ICD-10-CM | POA: Diagnosis not present

## 2016-09-09 DIAGNOSIS — J3089 Other allergic rhinitis: Secondary | ICD-10-CM | POA: Diagnosis not present

## 2016-09-16 DIAGNOSIS — J3089 Other allergic rhinitis: Secondary | ICD-10-CM | POA: Diagnosis not present

## 2016-09-16 DIAGNOSIS — J301 Allergic rhinitis due to pollen: Secondary | ICD-10-CM | POA: Diagnosis not present

## 2016-09-23 DIAGNOSIS — J301 Allergic rhinitis due to pollen: Secondary | ICD-10-CM | POA: Diagnosis not present

## 2016-09-23 DIAGNOSIS — J3089 Other allergic rhinitis: Secondary | ICD-10-CM | POA: Diagnosis not present

## 2016-09-30 DIAGNOSIS — J301 Allergic rhinitis due to pollen: Secondary | ICD-10-CM | POA: Diagnosis not present

## 2016-09-30 DIAGNOSIS — J3089 Other allergic rhinitis: Secondary | ICD-10-CM | POA: Diagnosis not present

## 2016-10-07 DIAGNOSIS — J3089 Other allergic rhinitis: Secondary | ICD-10-CM | POA: Diagnosis not present

## 2016-10-07 DIAGNOSIS — J301 Allergic rhinitis due to pollen: Secondary | ICD-10-CM | POA: Diagnosis not present

## 2016-10-07 MED FILL — AMLODIPINE BESYLATE 5 MG TA: 5 | 30 days supply | Qty: 30 | Fill #0

## 2016-10-07 MED FILL — HYDROCHLOROTHIAZIDE 12.5 MG: 12.5 | 30 days supply | Qty: 30 | Fill #0

## 2016-10-14 DIAGNOSIS — J3089 Other allergic rhinitis: Secondary | ICD-10-CM | POA: Diagnosis not present

## 2016-10-14 DIAGNOSIS — J301 Allergic rhinitis due to pollen: Secondary | ICD-10-CM | POA: Diagnosis not present

## 2016-10-21 DIAGNOSIS — J301 Allergic rhinitis due to pollen: Secondary | ICD-10-CM | POA: Diagnosis not present

## 2016-10-21 DIAGNOSIS — J3089 Other allergic rhinitis: Secondary | ICD-10-CM | POA: Diagnosis not present

## 2016-10-28 DIAGNOSIS — J3089 Other allergic rhinitis: Secondary | ICD-10-CM | POA: Diagnosis not present

## 2016-10-28 DIAGNOSIS — J301 Allergic rhinitis due to pollen: Secondary | ICD-10-CM | POA: Diagnosis not present

## 2016-10-31 DIAGNOSIS — R300 Dysuria: Secondary | ICD-10-CM | POA: Diagnosis not present

## 2016-10-31 DIAGNOSIS — N390 Urinary tract infection, site not specified: Secondary | ICD-10-CM | POA: Diagnosis not present

## 2016-10-31 MED FILL — CIPROFLOXACIN HCL 500 MG TA: 500 | 5 days supply | Qty: 10 | Fill #0

## 2016-11-09 DIAGNOSIS — J3089 Other allergic rhinitis: Secondary | ICD-10-CM | POA: Diagnosis not present

## 2016-11-09 DIAGNOSIS — J301 Allergic rhinitis due to pollen: Secondary | ICD-10-CM | POA: Diagnosis not present

## 2016-11-09 MED FILL — HYDROCHLOROTHIAZIDE 12.5 MG: 12.5 | 30 days supply | Qty: 30 | Fill #0

## 2016-11-09 MED FILL — AMLODIPINE BESYLATE 5 MG TA: 5 | 30 days supply | Qty: 30 | Fill #0

## 2016-11-15 DIAGNOSIS — J301 Allergic rhinitis due to pollen: Secondary | ICD-10-CM | POA: Diagnosis not present

## 2016-11-15 DIAGNOSIS — J3089 Other allergic rhinitis: Secondary | ICD-10-CM | POA: Diagnosis not present

## 2016-11-16 DIAGNOSIS — J301 Allergic rhinitis due to pollen: Secondary | ICD-10-CM | POA: Diagnosis not present

## 2016-11-16 DIAGNOSIS — J3089 Other allergic rhinitis: Secondary | ICD-10-CM | POA: Diagnosis not present

## 2016-11-24 DIAGNOSIS — J301 Allergic rhinitis due to pollen: Secondary | ICD-10-CM | POA: Diagnosis not present

## 2016-11-24 DIAGNOSIS — J3089 Other allergic rhinitis: Secondary | ICD-10-CM | POA: Diagnosis not present

## 2016-11-30 DIAGNOSIS — J301 Allergic rhinitis due to pollen: Secondary | ICD-10-CM | POA: Diagnosis not present

## 2016-11-30 DIAGNOSIS — J3089 Other allergic rhinitis: Secondary | ICD-10-CM | POA: Diagnosis not present

## 2016-12-07 DIAGNOSIS — J301 Allergic rhinitis due to pollen: Secondary | ICD-10-CM | POA: Diagnosis not present

## 2016-12-07 DIAGNOSIS — J3089 Other allergic rhinitis: Secondary | ICD-10-CM | POA: Diagnosis not present

## 2016-12-08 MED FILL — HYDROCHLOROTHIAZIDE 12.5 MG: 12.5 | 30 days supply | Qty: 30 | Fill #0

## 2016-12-08 MED FILL — AMLODIPINE BESYLATE 5 MG TA: 5 | 30 days supply | Qty: 30 | Fill #0

## 2016-12-13 DIAGNOSIS — R7303 Prediabetes: Secondary | ICD-10-CM | POA: Diagnosis not present

## 2016-12-13 DIAGNOSIS — I10 Essential (primary) hypertension: Secondary | ICD-10-CM | POA: Diagnosis not present

## 2016-12-13 DIAGNOSIS — J309 Allergic rhinitis, unspecified: Secondary | ICD-10-CM | POA: Diagnosis not present

## 2016-12-13 DIAGNOSIS — H6981 Other specified disorders of Eustachian tube, right ear: Secondary | ICD-10-CM | POA: Diagnosis not present

## 2016-12-14 DIAGNOSIS — J3089 Other allergic rhinitis: Secondary | ICD-10-CM | POA: Diagnosis not present

## 2016-12-14 DIAGNOSIS — J301 Allergic rhinitis due to pollen: Secondary | ICD-10-CM | POA: Diagnosis not present

## 2016-12-16 DIAGNOSIS — J3089 Other allergic rhinitis: Secondary | ICD-10-CM | POA: Diagnosis not present

## 2016-12-16 DIAGNOSIS — J301 Allergic rhinitis due to pollen: Secondary | ICD-10-CM | POA: Diagnosis not present

## 2016-12-21 DIAGNOSIS — J301 Allergic rhinitis due to pollen: Secondary | ICD-10-CM | POA: Diagnosis not present

## 2016-12-21 DIAGNOSIS — J3089 Other allergic rhinitis: Secondary | ICD-10-CM | POA: Diagnosis not present

## 2016-12-28 DIAGNOSIS — J3089 Other allergic rhinitis: Secondary | ICD-10-CM | POA: Diagnosis not present

## 2016-12-28 DIAGNOSIS — J301 Allergic rhinitis due to pollen: Secondary | ICD-10-CM | POA: Diagnosis not present

## 2016-12-28 DIAGNOSIS — E119 Type 2 diabetes mellitus without complications: Secondary | ICD-10-CM | POA: Diagnosis not present

## 2016-12-28 MED FILL — FREESTYLE LANCETS: 50 days supply | Qty: 100 | Fill #0

## 2016-12-28 MED FILL — FREESTYLE LITE TEST STRIP: 50 days supply | Qty: 100 | Fill #0

## 2016-12-30 MED FILL — FLUTICASONE PROP 50 MCG SPR: 50 | 60 days supply | Qty: 16 | Fill #0

## 2017-01-01 MED FILL — AMLODIPINE BESYLATE 5 MG TA: 5 | 90 days supply | Qty: 90 | Fill #0

## 2017-01-02 MED FILL — HYDROCHLOROTHIAZIDE 12.5 MG: 12.5 | 30 days supply | Qty: 30 | Fill #0

## 2017-01-04 DIAGNOSIS — J3089 Other allergic rhinitis: Secondary | ICD-10-CM | POA: Diagnosis not present

## 2017-01-04 DIAGNOSIS — J301 Allergic rhinitis due to pollen: Secondary | ICD-10-CM | POA: Diagnosis not present

## 2017-01-20 DIAGNOSIS — J3089 Other allergic rhinitis: Secondary | ICD-10-CM | POA: Diagnosis not present

## 2017-01-20 DIAGNOSIS — J301 Allergic rhinitis due to pollen: Secondary | ICD-10-CM | POA: Diagnosis not present

## 2017-01-27 DIAGNOSIS — J3089 Other allergic rhinitis: Secondary | ICD-10-CM | POA: Diagnosis not present

## 2017-01-27 DIAGNOSIS — J301 Allergic rhinitis due to pollen: Secondary | ICD-10-CM | POA: Diagnosis not present

## 2017-02-03 DIAGNOSIS — J301 Allergic rhinitis due to pollen: Secondary | ICD-10-CM | POA: Diagnosis not present

## 2017-02-03 DIAGNOSIS — J3089 Other allergic rhinitis: Secondary | ICD-10-CM | POA: Diagnosis not present

## 2017-02-09 MED FILL — HYDROCHLOROTHIAZIDE 12.5 MG: 12.5 | 90 days supply | Qty: 90 | Fill #1

## 2017-02-10 DIAGNOSIS — J3089 Other allergic rhinitis: Secondary | ICD-10-CM | POA: Diagnosis not present

## 2017-02-10 DIAGNOSIS — J301 Allergic rhinitis due to pollen: Secondary | ICD-10-CM | POA: Diagnosis not present

## 2017-02-17 DIAGNOSIS — J3089 Other allergic rhinitis: Secondary | ICD-10-CM | POA: Diagnosis not present

## 2017-02-17 DIAGNOSIS — J301 Allergic rhinitis due to pollen: Secondary | ICD-10-CM | POA: Diagnosis not present

## 2017-03-03 DIAGNOSIS — J301 Allergic rhinitis due to pollen: Secondary | ICD-10-CM | POA: Diagnosis not present

## 2017-03-03 DIAGNOSIS — J3089 Other allergic rhinitis: Secondary | ICD-10-CM | POA: Diagnosis not present

## 2017-03-10 DIAGNOSIS — J301 Allergic rhinitis due to pollen: Secondary | ICD-10-CM | POA: Diagnosis not present

## 2017-03-10 DIAGNOSIS — J3089 Other allergic rhinitis: Secondary | ICD-10-CM | POA: Diagnosis not present

## 2017-03-17 DIAGNOSIS — J3089 Other allergic rhinitis: Secondary | ICD-10-CM | POA: Diagnosis not present

## 2017-03-17 DIAGNOSIS — J301 Allergic rhinitis due to pollen: Secondary | ICD-10-CM | POA: Diagnosis not present

## 2017-03-24 DIAGNOSIS — J3089 Other allergic rhinitis: Secondary | ICD-10-CM | POA: Diagnosis not present

## 2017-03-24 DIAGNOSIS — J301 Allergic rhinitis due to pollen: Secondary | ICD-10-CM | POA: Diagnosis not present

## 2017-03-29 DIAGNOSIS — R8290 Unspecified abnormal findings in urine: Secondary | ICD-10-CM | POA: Diagnosis not present

## 2017-03-29 DIAGNOSIS — E119 Type 2 diabetes mellitus without complications: Secondary | ICD-10-CM | POA: Diagnosis not present

## 2017-03-29 DIAGNOSIS — I10 Essential (primary) hypertension: Secondary | ICD-10-CM | POA: Diagnosis not present

## 2017-03-31 DIAGNOSIS — J3089 Other allergic rhinitis: Secondary | ICD-10-CM | POA: Diagnosis not present

## 2017-03-31 DIAGNOSIS — J301 Allergic rhinitis due to pollen: Secondary | ICD-10-CM | POA: Diagnosis not present

## 2017-03-31 MED FILL — FREESTYLE LITE TEST STRIP: 50 days supply | Qty: 100 | Fill #1

## 2017-03-31 MED FILL — FREESTYLE LANCETS: 50 days supply | Qty: 100 | Fill #1

## 2017-04-10 DIAGNOSIS — J301 Allergic rhinitis due to pollen: Secondary | ICD-10-CM | POA: Diagnosis not present

## 2017-04-10 DIAGNOSIS — J3089 Other allergic rhinitis: Secondary | ICD-10-CM | POA: Diagnosis not present

## 2017-04-10 MED FILL — AMLODIPINE BESYLATE 5 MG TA: 5 | 90 days supply | Qty: 90 | Fill #1

## 2017-04-17 DIAGNOSIS — J301 Allergic rhinitis due to pollen: Secondary | ICD-10-CM | POA: Diagnosis not present

## 2017-04-17 DIAGNOSIS — J3089 Other allergic rhinitis: Secondary | ICD-10-CM | POA: Diagnosis not present

## 2017-04-25 DIAGNOSIS — J3089 Other allergic rhinitis: Secondary | ICD-10-CM | POA: Diagnosis not present

## 2017-04-25 DIAGNOSIS — J301 Allergic rhinitis due to pollen: Secondary | ICD-10-CM | POA: Diagnosis not present

## 2017-05-02 DIAGNOSIS — J3089 Other allergic rhinitis: Secondary | ICD-10-CM | POA: Diagnosis not present

## 2017-05-02 DIAGNOSIS — J301 Allergic rhinitis due to pollen: Secondary | ICD-10-CM | POA: Diagnosis not present

## 2017-05-09 DIAGNOSIS — J301 Allergic rhinitis due to pollen: Secondary | ICD-10-CM | POA: Diagnosis not present

## 2017-05-09 DIAGNOSIS — J3089 Other allergic rhinitis: Secondary | ICD-10-CM | POA: Diagnosis not present

## 2017-05-09 MED FILL — HYDROCHLOROTHIAZIDE 12.5 MG: 12.5 | 60 days supply | Qty: 60 | Fill #2

## 2017-05-16 DIAGNOSIS — J301 Allergic rhinitis due to pollen: Secondary | ICD-10-CM | POA: Diagnosis not present

## 2017-05-16 DIAGNOSIS — J3089 Other allergic rhinitis: Secondary | ICD-10-CM | POA: Diagnosis not present

## 2017-05-18 DIAGNOSIS — J301 Allergic rhinitis due to pollen: Secondary | ICD-10-CM | POA: Diagnosis not present

## 2017-05-18 DIAGNOSIS — J3089 Other allergic rhinitis: Secondary | ICD-10-CM | POA: Diagnosis not present

## 2017-05-25 DIAGNOSIS — J301 Allergic rhinitis due to pollen: Secondary | ICD-10-CM | POA: Diagnosis not present

## 2017-05-25 DIAGNOSIS — J3089 Other allergic rhinitis: Secondary | ICD-10-CM | POA: Diagnosis not present

## 2017-06-01 DIAGNOSIS — J301 Allergic rhinitis due to pollen: Secondary | ICD-10-CM | POA: Diagnosis not present

## 2017-06-01 DIAGNOSIS — J3089 Other allergic rhinitis: Secondary | ICD-10-CM | POA: Diagnosis not present

## 2017-06-08 DIAGNOSIS — J301 Allergic rhinitis due to pollen: Secondary | ICD-10-CM | POA: Diagnosis not present

## 2017-06-08 DIAGNOSIS — J3089 Other allergic rhinitis: Secondary | ICD-10-CM | POA: Diagnosis not present

## 2017-06-15 DIAGNOSIS — J3089 Other allergic rhinitis: Secondary | ICD-10-CM | POA: Diagnosis not present

## 2017-06-15 DIAGNOSIS — J301 Allergic rhinitis due to pollen: Secondary | ICD-10-CM | POA: Diagnosis not present

## 2017-06-20 DIAGNOSIS — I10 Essential (primary) hypertension: Secondary | ICD-10-CM | POA: Diagnosis not present

## 2017-06-20 DIAGNOSIS — E559 Vitamin D deficiency, unspecified: Secondary | ICD-10-CM | POA: Diagnosis not present

## 2017-06-20 DIAGNOSIS — E119 Type 2 diabetes mellitus without complications: Secondary | ICD-10-CM | POA: Diagnosis not present

## 2017-06-22 ENCOUNTER — Encounter: Payer: Self-pay | Admitting: Gastroenterology

## 2017-06-23 DIAGNOSIS — J301 Allergic rhinitis due to pollen: Secondary | ICD-10-CM | POA: Diagnosis not present

## 2017-06-23 DIAGNOSIS — J3089 Other allergic rhinitis: Secondary | ICD-10-CM | POA: Diagnosis not present

## 2017-06-27 DIAGNOSIS — J301 Allergic rhinitis due to pollen: Secondary | ICD-10-CM | POA: Diagnosis not present

## 2017-06-27 DIAGNOSIS — J3089 Other allergic rhinitis: Secondary | ICD-10-CM | POA: Diagnosis not present

## 2017-06-30 DIAGNOSIS — J301 Allergic rhinitis due to pollen: Secondary | ICD-10-CM | POA: Diagnosis not present

## 2017-06-30 DIAGNOSIS — J3089 Other allergic rhinitis: Secondary | ICD-10-CM | POA: Diagnosis not present

## 2017-07-07 DIAGNOSIS — J3089 Other allergic rhinitis: Secondary | ICD-10-CM | POA: Diagnosis not present

## 2017-07-07 DIAGNOSIS — J301 Allergic rhinitis due to pollen: Secondary | ICD-10-CM | POA: Diagnosis not present

## 2017-07-14 DIAGNOSIS — J3089 Other allergic rhinitis: Secondary | ICD-10-CM | POA: Diagnosis not present

## 2017-07-14 DIAGNOSIS — J301 Allergic rhinitis due to pollen: Secondary | ICD-10-CM | POA: Diagnosis not present

## 2017-07-18 MED FILL — AMLODIPINE BESYLATE 5 MG TA: 5 | 90 days supply | Qty: 90 | Fill #0

## 2017-07-21 DIAGNOSIS — J301 Allergic rhinitis due to pollen: Secondary | ICD-10-CM | POA: Diagnosis not present

## 2017-07-21 DIAGNOSIS — J3089 Other allergic rhinitis: Secondary | ICD-10-CM | POA: Diagnosis not present

## 2017-07-28 DIAGNOSIS — J3089 Other allergic rhinitis: Secondary | ICD-10-CM | POA: Diagnosis not present

## 2017-07-28 DIAGNOSIS — J301 Allergic rhinitis due to pollen: Secondary | ICD-10-CM | POA: Diagnosis not present

## 2017-08-01 DIAGNOSIS — J301 Allergic rhinitis due to pollen: Secondary | ICD-10-CM | POA: Diagnosis not present

## 2017-08-01 DIAGNOSIS — J3089 Other allergic rhinitis: Secondary | ICD-10-CM | POA: Diagnosis not present

## 2017-08-08 ENCOUNTER — Ambulatory Visit (AMBULATORY_SURGERY_CENTER): Payer: Self-pay

## 2017-08-08 VITALS — Ht 68.0 in | Wt 161.0 lb

## 2017-08-08 DIAGNOSIS — Z1211 Encounter for screening for malignant neoplasm of colon: Secondary | ICD-10-CM

## 2017-08-08 MED ORDER — NA SULFATE-K SULFATE-MG SULF 17.5-3.13-1.6 GM/177ML PO SOLN: 1.0000 | Freq: Once | ORAL | 0 refills | Status: AC

## 2017-08-08 MED FILL — SUPREP BOWEL PREP KIT: 17.5-3.13-1 | 2 days supply | Qty: 354 | Fill #0

## 2017-08-08 NOTE — Progress Notes (Signed)
Per pt, no allergies to soy or egg products.Pt not taking any weight loss meds or using  O2 at home.  Emmi video sent to pt's email. 

## 2017-08-09 ENCOUNTER — Encounter: Payer: Self-pay | Admitting: Gastroenterology

## 2017-08-11 DIAGNOSIS — J3089 Other allergic rhinitis: Secondary | ICD-10-CM | POA: Diagnosis not present

## 2017-08-11 DIAGNOSIS — J301 Allergic rhinitis due to pollen: Secondary | ICD-10-CM | POA: Diagnosis not present

## 2017-08-15 DIAGNOSIS — J301 Allergic rhinitis due to pollen: Secondary | ICD-10-CM | POA: Diagnosis not present

## 2017-08-15 DIAGNOSIS — J3089 Other allergic rhinitis: Secondary | ICD-10-CM | POA: Diagnosis not present

## 2017-08-17 DIAGNOSIS — J301 Allergic rhinitis due to pollen: Secondary | ICD-10-CM | POA: Diagnosis not present

## 2017-08-17 DIAGNOSIS — H1045 Other chronic allergic conjunctivitis: Secondary | ICD-10-CM | POA: Diagnosis not present

## 2017-08-17 DIAGNOSIS — J3089 Other allergic rhinitis: Secondary | ICD-10-CM | POA: Diagnosis not present

## 2017-08-18 ENCOUNTER — Encounter: Payer: Self-pay | Admitting: Gastroenterology

## 2017-08-18 ENCOUNTER — Ambulatory Visit (AMBULATORY_SURGERY_CENTER): Payer: 59 | Admitting: Gastroenterology

## 2017-08-18 ENCOUNTER — Other Ambulatory Visit: Payer: Self-pay

## 2017-08-18 VITALS — BP 115/67 | HR 67 | Temp 96.9°F | Resp 17 | Ht 68.0 in | Wt 161.0 lb

## 2017-08-18 DIAGNOSIS — D123 Benign neoplasm of transverse colon: Secondary | ICD-10-CM

## 2017-08-18 DIAGNOSIS — Z1211 Encounter for screening for malignant neoplasm of colon: Secondary | ICD-10-CM

## 2017-08-18 MED ORDER — SODIUM CHLORIDE 0.9 % IV SOLN: 500.0000 mL | Freq: Once | INTRAVENOUS | Status: AC

## 2017-08-18 NOTE — Op Note (Signed)
[facility] Patient Name: Annabella Elford Procedure Date: 08/18/2017 7:53 AM MRN: [id number] Endoscopist: Remo Lipps P. Armbruster MD, MD Age: 56 Referring MD:  Date of Birth: 02-Feb-1961 Gender: Female Account #: [id number] Procedure:                Colonoscopy Indications:              Screening for colorectal malignant neoplasm, This                            is the patient's first colonoscopy Medicines:                Monitored Anesthesia Care Procedure:                Pre-Anesthesia Assessment:                           - Prior to the procedure, a History and Physical                            was performed, and patient medications and                            allergies were reviewed. The patient's tolerance of                            previous anesthesia was also reviewed. The risks                            and benefits of the procedure and the sedation                            options and risks were discussed with the patient.                            All questions were answered, and informed consent                            was obtained. Prior Anticoagulants: The patient has                            taken no previous anticoagulant or antiplatelet                            agents. ASA Grade Assessment: II - A patient with                            mild systemic disease. After reviewing the risks                            and benefits, the patient was deemed in                            satisfactory condition to undergo the procedure.  After obtaining informed consent, the colonoscope                            was passed under direct vision. Throughout the                            procedure, the patient's blood pressure, pulse, and                            oxygen saturations were monitored continuously. The                            Colonoscope was introduced through the anus and                            advanced to the the  cecum, identified by                            appendiceal orifice and ileocecal valve. The                            colonoscopy was performed without difficulty. The                            patient tolerated the procedure well. The quality                            of the bowel preparation was good. The ileocecal                            valve, appendiceal orifice, and rectum were                            photographed. Scope In: 8:08:10 AM Scope Out: 8:25:57 AM Scope Withdrawal Time: 0 hours 14 minutes 52 seconds  Total Procedure Duration: 0 hours 17 minutes 47 seconds  Findings:                 The perianal and digital rectal examinations were                            normal.                           Two sessile polyps were found in the hepatic                            flexure. The polyps were 3 to 4 mm in size. These                            polyps were removed with a cold snare. Resection                            and retrieval were complete.  A few small-mouthed diverticula were found in the                            transverse colon and left colon.                           The exam was otherwise without abnormality. Complications:            No immediate complications. Estimated blood loss:                            Minimal. Estimated Blood Loss:     Estimated blood loss was minimal. Impression:               - Two 3 to 4 mm polyps at the hepatic flexure,                            removed with a cold snare. Resected and retrieved.                           - Diverticulosis in the transverse colon and in the                            left colon.                           - The examination was otherwise normal. Recommendation:           - Patient has a contact number available for                            emergencies. The signs and symptoms of potential                            delayed complications were discussed with the                             patient. Return to normal activities tomorrow.                            Written discharge instructions were provided to the                            patient.                           - Resume previous diet.                           - Continue present medications.                           - Await pathology results.                           - Repeat colonoscopy is recommended for  surveillance. The colonoscopy date will be                            determined after pathology results from today's                            exam become available for review. Remo Lipps P. Armbruster MD, MD 08/18/2017 8:28:54 AM This report has been signed electronically.

## 2017-08-18 NOTE — Progress Notes (Signed)
Called to room to assist during endoscopic procedure.  Patient ID and intended procedure confirmed with present staff. Received instructions for my participation in the procedure from the performing physician.  

## 2017-08-18 NOTE — Progress Notes (Signed)
To PACU, VSS. Report to [email or web address] 

## 2017-08-18 NOTE — Patient Instructions (Signed)
YOU HAD AN ENDOSCOPIC PROCEDURE TODAY AT [facility]:   Refer to the procedure report that was given to you for any specific questions about what was found during the examination.  If the procedure report does not answer your questions, please call your gastroenterologist to clarify.  If you requested that your care partner not be given the details of your procedure findings, then the procedure report has been included in a sealed envelope for you to review at your convenience later.  YOU SHOULD EXPECT: Some feelings of bloating in the abdomen. Passage of more gas than usual.  Walking can help get rid of the air that was put into your GI tract during the procedure and reduce the bloating. If you had a lower endoscopy (such as a colonoscopy or flexible sigmoidoscopy) you may notice spotting of blood in your stool or on the toilet paper. If you underwent a bowel prep for your procedure, you may not have a normal bowel movement for a few days.  Please Note:  You might notice some irritation and congestion in your nose or some drainage.  This is from the oxygen used during your procedure.  There is no need for concern and it should clear up in a day or so.  SYMPTOMS TO REPORT IMMEDIATELY:   Following lower endoscopy (colonoscopy or flexible sigmoidoscopy):  Excessive amounts of blood in the stool  Significant tenderness or worsening of abdominal pains  Swelling of the abdomen that is new, acute  Fever of 100F or higher  For urgent or emergent issues, a gastroenterologist can be reached at any hour by calling [phone].  Please see handouts on polyps and diverticulosis.   DIET:  We do recommend a small meal at first, but then you may proceed to your regular diet.  Drink plenty of fluids but you should avoid alcoholic beverages for 24 hours.  ACTIVITY:  You should plan to take it easy for the rest of today and you should NOT DRIVE or use heavy machinery until tomorrow (because  of the sedation medicines used during the test).    FOLLOW UP: Our staff will call the number listed on your records the next business day following your procedure to check on you and address any questions or concerns that you may have regarding the information given to you following your procedure. If we do not reach you, we will leave a message.  However, if you are feeling well and you are not experiencing any problems, there is no need to return our call.  We will assume that you have returned to your regular daily activities without incident.  If any biopsies were taken you will be contacted by phone or by letter within the next 1-3 weeks.  Please call us at [phone] if you have not heard about the biopsies in 3 weeks.    SIGNATURES/CONFIDENTIALITY: You and/or your care partner have signed paperwork which will be entered into your electronic medical record.  These signatures attest to the fact that that the information above on your After Visit Summary has been reviewed and is understood.  Full responsibility of the confidentiality of this discharge information lies with you and/or your care-partner.  Thank you for letting us take care of your healthcare needs today.

## 2017-08-23 ENCOUNTER — Telehealth: Payer: Self-pay | Admitting: *Deleted

## 2017-08-23 NOTE — Telephone Encounter (Signed)
No answer, message left for the patient. 

## 2017-08-23 NOTE — Telephone Encounter (Signed)
  Follow up Call-  Call back number 08/18/2017  Post procedure Call Back phone  # [phone]  Permission to leave phone message Yes  Some recent data might be hidden     Patient questions:  Do you have a fever, pain , or abdominal swelling? No. Pain Score  0 *  Have you tolerated food without any problems? Yes.    Have you been able to return to your normal activities? Yes.    Do you have any questions about your discharge instructions: Diet   No. Medications  No. Follow up visit  No.  Do you have questions or concerns about your Care? No.  Actions: * If pain score is 4 or above: No action needed, pain <4.

## 2017-08-27 ENCOUNTER — Encounter: Payer: Self-pay | Admitting: Gastroenterology

## 2017-08-30 DIAGNOSIS — J301 Allergic rhinitis due to pollen: Secondary | ICD-10-CM | POA: Diagnosis not present

## 2017-08-30 DIAGNOSIS — J3089 Other allergic rhinitis: Secondary | ICD-10-CM | POA: Diagnosis not present

## 2017-09-11 DIAGNOSIS — J301 Allergic rhinitis due to pollen: Secondary | ICD-10-CM | POA: Diagnosis not present

## 2017-09-11 DIAGNOSIS — J3089 Other allergic rhinitis: Secondary | ICD-10-CM | POA: Diagnosis not present

## 2017-09-25 DIAGNOSIS — J301 Allergic rhinitis due to pollen: Secondary | ICD-10-CM | POA: Diagnosis not present

## 2017-09-25 DIAGNOSIS — J3089 Other allergic rhinitis: Secondary | ICD-10-CM | POA: Diagnosis not present

## 2017-10-10 DIAGNOSIS — J301 Allergic rhinitis due to pollen: Secondary | ICD-10-CM | POA: Diagnosis not present

## 2017-10-10 DIAGNOSIS — J3089 Other allergic rhinitis: Secondary | ICD-10-CM | POA: Diagnosis not present

## 2017-10-13 MED FILL — AMLODIPINE BESYLATE 5 MG TA: 5 | 90 days supply | Qty: 90 | Fill #0

## 2017-10-26 DIAGNOSIS — J301 Allergic rhinitis due to pollen: Secondary | ICD-10-CM | POA: Diagnosis not present

## 2017-10-26 DIAGNOSIS — J3089 Other allergic rhinitis: Secondary | ICD-10-CM | POA: Diagnosis not present

## 2017-11-10 DIAGNOSIS — J301 Allergic rhinitis due to pollen: Secondary | ICD-10-CM | POA: Diagnosis not present

## 2017-11-10 DIAGNOSIS — J3089 Other allergic rhinitis: Secondary | ICD-10-CM | POA: Diagnosis not present

## 2017-11-24 DIAGNOSIS — J3089 Other allergic rhinitis: Secondary | ICD-10-CM | POA: Diagnosis not present

## 2017-11-24 DIAGNOSIS — J301 Allergic rhinitis due to pollen: Secondary | ICD-10-CM | POA: Diagnosis not present

## 2017-11-29 DIAGNOSIS — J3089 Other allergic rhinitis: Secondary | ICD-10-CM | POA: Diagnosis not present

## 2017-11-29 DIAGNOSIS — J301 Allergic rhinitis due to pollen: Secondary | ICD-10-CM | POA: Diagnosis not present

## 2017-12-08 DIAGNOSIS — J3089 Other allergic rhinitis: Secondary | ICD-10-CM | POA: Diagnosis not present

## 2017-12-08 DIAGNOSIS — J301 Allergic rhinitis due to pollen: Secondary | ICD-10-CM | POA: Diagnosis not present

## 2017-12-22 DIAGNOSIS — J301 Allergic rhinitis due to pollen: Secondary | ICD-10-CM | POA: Diagnosis not present

## 2017-12-22 DIAGNOSIS — J3089 Other allergic rhinitis: Secondary | ICD-10-CM | POA: Diagnosis not present

## 2018-01-03 DIAGNOSIS — E559 Vitamin D deficiency, unspecified: Secondary | ICD-10-CM | POA: Diagnosis not present

## 2018-01-03 DIAGNOSIS — E119 Type 2 diabetes mellitus without complications: Secondary | ICD-10-CM | POA: Diagnosis not present

## 2018-01-03 DIAGNOSIS — E785 Hyperlipidemia, unspecified: Secondary | ICD-10-CM | POA: Diagnosis not present

## 2018-01-03 DIAGNOSIS — I10 Essential (primary) hypertension: Secondary | ICD-10-CM | POA: Diagnosis not present

## 2018-01-04 DIAGNOSIS — J301 Allergic rhinitis due to pollen: Secondary | ICD-10-CM | POA: Diagnosis not present

## 2018-01-04 DIAGNOSIS — J3089 Other allergic rhinitis: Secondary | ICD-10-CM | POA: Diagnosis not present

## 2018-01-04 DIAGNOSIS — E119 Type 2 diabetes mellitus without complications: Secondary | ICD-10-CM | POA: Diagnosis not present

## 2018-01-05 ENCOUNTER — Other Ambulatory Visit: Payer: Self-pay | Admitting: Physician Assistant

## 2018-01-05 DIAGNOSIS — Z1231 Encounter for screening mammogram for malignant neoplasm of breast: Secondary | ICD-10-CM

## 2018-01-17 DIAGNOSIS — J3089 Other allergic rhinitis: Secondary | ICD-10-CM | POA: Diagnosis not present

## 2018-01-17 DIAGNOSIS — J301 Allergic rhinitis due to pollen: Secondary | ICD-10-CM | POA: Diagnosis not present

## 2018-01-18 MED FILL — AMLODIPINE BESYLATE 5 MG TA: 5 | 90 days supply | Qty: 90 | Fill #0

## 2018-01-25 ENCOUNTER — Ambulatory Visit
Admission: RE | Admit: 2018-01-25 | Discharge: 2018-01-25 | Disposition: A | Discharge status: Home / Self Care | Payer: 59 | Source: Ambulatory Visit | Attending: Physician Assistant | Admitting: Physician Assistant

## 2018-01-25 DIAGNOSIS — Z1231 Encounter for screening mammogram for malignant neoplasm of breast: Secondary | ICD-10-CM

## 2018-01-31 DIAGNOSIS — J3089 Other allergic rhinitis: Secondary | ICD-10-CM | POA: Diagnosis not present

## 2018-01-31 DIAGNOSIS — J301 Allergic rhinitis due to pollen: Secondary | ICD-10-CM | POA: Diagnosis not present

## 2018-02-02 DIAGNOSIS — J301 Allergic rhinitis due to pollen: Secondary | ICD-10-CM | POA: Diagnosis not present

## 2018-02-02 DIAGNOSIS — J3089 Other allergic rhinitis: Secondary | ICD-10-CM | POA: Diagnosis not present

## 2018-02-06 DIAGNOSIS — J3089 Other allergic rhinitis: Secondary | ICD-10-CM | POA: Diagnosis not present

## 2018-02-06 DIAGNOSIS — J301 Allergic rhinitis due to pollen: Secondary | ICD-10-CM | POA: Diagnosis not present

## 2018-02-07 DIAGNOSIS — E785 Hyperlipidemia, unspecified: Secondary | ICD-10-CM | POA: Diagnosis not present

## 2018-02-08 DIAGNOSIS — J301 Allergic rhinitis due to pollen: Secondary | ICD-10-CM | POA: Diagnosis not present

## 2018-02-08 DIAGNOSIS — J3089 Other allergic rhinitis: Secondary | ICD-10-CM | POA: Diagnosis not present

## 2018-02-21 DIAGNOSIS — J3089 Other allergic rhinitis: Secondary | ICD-10-CM | POA: Diagnosis not present

## 2018-02-21 DIAGNOSIS — J301 Allergic rhinitis due to pollen: Secondary | ICD-10-CM | POA: Diagnosis not present

## 2018-02-23 DIAGNOSIS — J3089 Other allergic rhinitis: Secondary | ICD-10-CM | POA: Diagnosis not present

## 2018-02-23 DIAGNOSIS — J301 Allergic rhinitis due to pollen: Secondary | ICD-10-CM | POA: Diagnosis not present

## 2018-03-06 DIAGNOSIS — J3089 Other allergic rhinitis: Secondary | ICD-10-CM | POA: Diagnosis not present

## 2018-03-06 DIAGNOSIS — J301 Allergic rhinitis due to pollen: Secondary | ICD-10-CM | POA: Diagnosis not present

## 2018-03-14 DIAGNOSIS — J3089 Other allergic rhinitis: Secondary | ICD-10-CM | POA: Diagnosis not present

## 2018-03-14 DIAGNOSIS — J301 Allergic rhinitis due to pollen: Secondary | ICD-10-CM | POA: Diagnosis not present

## 2018-03-20 DIAGNOSIS — J3089 Other allergic rhinitis: Secondary | ICD-10-CM | POA: Diagnosis not present

## 2018-03-20 DIAGNOSIS — J301 Allergic rhinitis due to pollen: Secondary | ICD-10-CM | POA: Diagnosis not present

## 2018-03-30 DIAGNOSIS — J3089 Other allergic rhinitis: Secondary | ICD-10-CM | POA: Diagnosis not present

## 2018-03-30 DIAGNOSIS — J301 Allergic rhinitis due to pollen: Secondary | ICD-10-CM | POA: Diagnosis not present

## 2018-04-06 DIAGNOSIS — J3089 Other allergic rhinitis: Secondary | ICD-10-CM | POA: Diagnosis not present

## 2018-04-06 DIAGNOSIS — J301 Allergic rhinitis due to pollen: Secondary | ICD-10-CM | POA: Diagnosis not present

## 2018-04-13 DIAGNOSIS — J301 Allergic rhinitis due to pollen: Secondary | ICD-10-CM | POA: Diagnosis not present

## 2018-04-13 DIAGNOSIS — J3089 Other allergic rhinitis: Secondary | ICD-10-CM | POA: Diagnosis not present

## 2018-04-16 DIAGNOSIS — J3089 Other allergic rhinitis: Secondary | ICD-10-CM | POA: Diagnosis not present

## 2018-04-16 DIAGNOSIS — J301 Allergic rhinitis due to pollen: Secondary | ICD-10-CM | POA: Diagnosis not present

## 2018-04-27 DIAGNOSIS — J3089 Other allergic rhinitis: Secondary | ICD-10-CM | POA: Diagnosis not present

## 2018-04-27 DIAGNOSIS — J301 Allergic rhinitis due to pollen: Secondary | ICD-10-CM | POA: Diagnosis not present

## 2018-05-04 DIAGNOSIS — J3089 Other allergic rhinitis: Secondary | ICD-10-CM | POA: Diagnosis not present

## 2018-05-04 DIAGNOSIS — J301 Allergic rhinitis due to pollen: Secondary | ICD-10-CM | POA: Diagnosis not present

## 2018-05-16 DIAGNOSIS — E785 Hyperlipidemia, unspecified: Secondary | ICD-10-CM | POA: Diagnosis not present

## 2018-05-16 DIAGNOSIS — J3089 Other allergic rhinitis: Secondary | ICD-10-CM | POA: Diagnosis not present

## 2018-05-16 DIAGNOSIS — J301 Allergic rhinitis due to pollen: Secondary | ICD-10-CM | POA: Diagnosis not present

## 2018-05-16 MED FILL — AMLODIPINE BESYLATE 5 MG TA: 5 | 90 days supply | Qty: 90 | Fill #1

## 2018-06-01 DIAGNOSIS — J301 Allergic rhinitis due to pollen: Secondary | ICD-10-CM | POA: Diagnosis not present

## 2018-06-01 DIAGNOSIS — J3089 Other allergic rhinitis: Secondary | ICD-10-CM | POA: Diagnosis not present

## 2018-06-08 DIAGNOSIS — J301 Allergic rhinitis due to pollen: Secondary | ICD-10-CM | POA: Diagnosis not present

## 2018-06-08 DIAGNOSIS — J3089 Other allergic rhinitis: Secondary | ICD-10-CM | POA: Diagnosis not present

## 2018-06-22 DIAGNOSIS — J3089 Other allergic rhinitis: Secondary | ICD-10-CM | POA: Diagnosis not present

## 2018-06-22 DIAGNOSIS — J301 Allergic rhinitis due to pollen: Secondary | ICD-10-CM | POA: Diagnosis not present

## 2018-06-29 DIAGNOSIS — J3089 Other allergic rhinitis: Secondary | ICD-10-CM | POA: Diagnosis not present

## 2018-06-29 DIAGNOSIS — J301 Allergic rhinitis due to pollen: Secondary | ICD-10-CM | POA: Diagnosis not present

## 2018-07-06 DIAGNOSIS — J301 Allergic rhinitis due to pollen: Secondary | ICD-10-CM | POA: Diagnosis not present

## 2018-07-06 DIAGNOSIS — J3089 Other allergic rhinitis: Secondary | ICD-10-CM | POA: Diagnosis not present

## 2018-07-13 DIAGNOSIS — J3089 Other allergic rhinitis: Secondary | ICD-10-CM | POA: Diagnosis not present

## 2018-07-13 DIAGNOSIS — J301 Allergic rhinitis due to pollen: Secondary | ICD-10-CM | POA: Diagnosis not present

## 2018-07-19 DIAGNOSIS — E119 Type 2 diabetes mellitus without complications: Secondary | ICD-10-CM | POA: Diagnosis not present

## 2018-07-19 DIAGNOSIS — R0981 Nasal congestion: Secondary | ICD-10-CM | POA: Diagnosis not present

## 2018-07-19 DIAGNOSIS — I10 Essential (primary) hypertension: Secondary | ICD-10-CM | POA: Diagnosis not present

## 2018-07-19 DIAGNOSIS — E785 Hyperlipidemia, unspecified: Secondary | ICD-10-CM | POA: Diagnosis not present

## 2018-07-19 DIAGNOSIS — E559 Vitamin D deficiency, unspecified: Secondary | ICD-10-CM | POA: Diagnosis not present

## 2018-07-19 MED FILL — FREESTYLE LANCETS: 50 days supply | Qty: 100 | Fill #0

## 2018-07-19 MED FILL — FREESTYLE LITE TEST STRIP: 50 days supply | Qty: 100 | Fill #0

## 2018-07-19 MED FILL — FLUTICASONE PROP 50 MCG SPR: 50 | 60 days supply | Qty: 16 | Fill #0

## 2018-07-20 DIAGNOSIS — J301 Allergic rhinitis due to pollen: Secondary | ICD-10-CM | POA: Diagnosis not present

## 2018-07-20 DIAGNOSIS — J3089 Other allergic rhinitis: Secondary | ICD-10-CM | POA: Diagnosis not present

## 2018-07-23 DIAGNOSIS — J301 Allergic rhinitis due to pollen: Secondary | ICD-10-CM | POA: Diagnosis not present

## 2018-07-23 DIAGNOSIS — J3089 Other allergic rhinitis: Secondary | ICD-10-CM | POA: Diagnosis not present

## 2018-07-26 DIAGNOSIS — J301 Allergic rhinitis due to pollen: Secondary | ICD-10-CM | POA: Diagnosis not present

## 2018-07-26 DIAGNOSIS — J3089 Other allergic rhinitis: Secondary | ICD-10-CM | POA: Diagnosis not present

## 2018-07-31 DIAGNOSIS — J3089 Other allergic rhinitis: Secondary | ICD-10-CM | POA: Diagnosis not present

## 2018-07-31 DIAGNOSIS — J301 Allergic rhinitis due to pollen: Secondary | ICD-10-CM | POA: Diagnosis not present

## 2018-08-03 DIAGNOSIS — J3089 Other allergic rhinitis: Secondary | ICD-10-CM | POA: Diagnosis not present

## 2018-08-03 DIAGNOSIS — J301 Allergic rhinitis due to pollen: Secondary | ICD-10-CM | POA: Diagnosis not present

## 2018-08-07 DIAGNOSIS — J301 Allergic rhinitis due to pollen: Secondary | ICD-10-CM | POA: Diagnosis not present

## 2018-08-07 DIAGNOSIS — J3089 Other allergic rhinitis: Secondary | ICD-10-CM | POA: Diagnosis not present

## 2018-08-10 MED FILL — AMLODIPINE BESYLATE 5 MG TA: 5 | 90 days supply | Qty: 90 | Fill #0

## 2018-08-16 DIAGNOSIS — J301 Allergic rhinitis due to pollen: Secondary | ICD-10-CM | POA: Diagnosis not present

## 2018-08-16 DIAGNOSIS — J0100 Acute maxillary sinusitis, unspecified: Secondary | ICD-10-CM | POA: Diagnosis not present

## 2018-08-16 DIAGNOSIS — J3089 Other allergic rhinitis: Secondary | ICD-10-CM | POA: Diagnosis not present

## 2018-08-16 DIAGNOSIS — H1045 Other chronic allergic conjunctivitis: Secondary | ICD-10-CM | POA: Diagnosis not present

## 2018-08-16 MED FILL — CEFDINIR 300 MG CAPSULE: 300 | 10 days supply | Qty: 20 | Fill #0

## 2018-08-22 DIAGNOSIS — J301 Allergic rhinitis due to pollen: Secondary | ICD-10-CM | POA: Diagnosis not present

## 2018-08-22 DIAGNOSIS — J3089 Other allergic rhinitis: Secondary | ICD-10-CM | POA: Diagnosis not present

## 2018-08-31 DIAGNOSIS — J301 Allergic rhinitis due to pollen: Secondary | ICD-10-CM | POA: Diagnosis not present

## 2018-08-31 DIAGNOSIS — J3089 Other allergic rhinitis: Secondary | ICD-10-CM | POA: Diagnosis not present

## 2018-09-09 DIAGNOSIS — H5203 Hypermetropia, bilateral: Secondary | ICD-10-CM | POA: Diagnosis not present

## 2018-09-14 DIAGNOSIS — J301 Allergic rhinitis due to pollen: Secondary | ICD-10-CM | POA: Diagnosis not present

## 2018-09-28 DIAGNOSIS — J301 Allergic rhinitis due to pollen: Secondary | ICD-10-CM | POA: Diagnosis not present

## 2018-09-28 DIAGNOSIS — J3089 Other allergic rhinitis: Secondary | ICD-10-CM | POA: Diagnosis not present

## 2018-10-12 DIAGNOSIS — J301 Allergic rhinitis due to pollen: Secondary | ICD-10-CM | POA: Diagnosis not present

## 2018-10-12 DIAGNOSIS — J3089 Other allergic rhinitis: Secondary | ICD-10-CM | POA: Diagnosis not present

## 2018-10-26 DIAGNOSIS — J3089 Other allergic rhinitis: Secondary | ICD-10-CM | POA: Diagnosis not present

## 2018-10-26 DIAGNOSIS — J301 Allergic rhinitis due to pollen: Secondary | ICD-10-CM | POA: Diagnosis not present

## 2018-11-09 DIAGNOSIS — J301 Allergic rhinitis due to pollen: Secondary | ICD-10-CM | POA: Diagnosis not present

## 2018-11-09 DIAGNOSIS — J3089 Other allergic rhinitis: Secondary | ICD-10-CM | POA: Diagnosis not present

## 2018-11-13 MED FILL — AMLODIPINE BESYLATE 5 MG TA: 5 | 90 days supply | Qty: 90 | Fill #1

## 2018-11-26 MED FILL — EPINEPHRINE 0.3 MG AUTO-INJ: 0.3 | 30 days supply | Qty: 2 | Fill #0

## 2018-11-28 DIAGNOSIS — J3089 Other allergic rhinitis: Secondary | ICD-10-CM | POA: Diagnosis not present

## 2018-11-28 DIAGNOSIS — J301 Allergic rhinitis due to pollen: Secondary | ICD-10-CM | POA: Diagnosis not present

## 2018-12-12 DIAGNOSIS — J3089 Other allergic rhinitis: Secondary | ICD-10-CM | POA: Diagnosis not present

## 2018-12-12 DIAGNOSIS — J301 Allergic rhinitis due to pollen: Secondary | ICD-10-CM | POA: Diagnosis not present

## 2019-01-18 DIAGNOSIS — E559 Vitamin D deficiency, unspecified: Secondary | ICD-10-CM | POA: Diagnosis not present

## 2019-01-18 DIAGNOSIS — I10 Essential (primary) hypertension: Secondary | ICD-10-CM | POA: Diagnosis not present

## 2019-01-18 DIAGNOSIS — E1169 Type 2 diabetes mellitus with other specified complication: Secondary | ICD-10-CM | POA: Diagnosis not present

## 2019-01-18 DIAGNOSIS — E785 Hyperlipidemia, unspecified: Secondary | ICD-10-CM | POA: Diagnosis not present

## 2019-01-18 DIAGNOSIS — J019 Acute sinusitis, unspecified: Secondary | ICD-10-CM | POA: Diagnosis not present

## 2019-01-21 DIAGNOSIS — E785 Hyperlipidemia, unspecified: Secondary | ICD-10-CM | POA: Diagnosis not present

## 2019-01-21 DIAGNOSIS — I10 Essential (primary) hypertension: Secondary | ICD-10-CM | POA: Diagnosis not present

## 2019-01-21 DIAGNOSIS — E1169 Type 2 diabetes mellitus with other specified complication: Secondary | ICD-10-CM | POA: Diagnosis not present

## 2019-01-22 MED FILL — ATORVASTATIN 10 MG TABLET: 10 | 30 days supply | Qty: 30 | Fill #0

## 2019-01-24 MED FILL — AMLODIPINE BESYLATE 5 MG TA: 5 | 90 days supply | Qty: 90 | Fill #0

## 2019-02-07 MED FILL — FLUTICASONE PROP 50 MCG SPR: 50 | 60 days supply | Qty: 16 | Fill #1

## 2019-02-19 MED FILL — AZITHROMYCIN 250 MG TABS: 250 | 5 days supply | Qty: 6 | Fill #0

## 2019-02-21 MED FILL — ATORVASTATIN 10 MG TABLET: 10 | 30 days supply | Qty: 30 | Fill #1

## 2019-03-05 DIAGNOSIS — Z79899 Other long term (current) drug therapy: Secondary | ICD-10-CM | POA: Diagnosis not present

## 2019-03-05 DIAGNOSIS — E785 Hyperlipidemia, unspecified: Secondary | ICD-10-CM | POA: Diagnosis not present

## 2019-03-22 MED FILL — ATORVASTATIN 10 MG TABLET: 10 | 90 days supply | Qty: 90 | Fill #2

## 2019-05-08 MED FILL — AMLODIPINE BESYLATE 5 MG TA: 5 | 90 days supply | Qty: 90 | Fill #1

## 2019-07-09 DIAGNOSIS — I10 Essential (primary) hypertension: Secondary | ICD-10-CM | POA: Diagnosis not present

## 2019-07-09 DIAGNOSIS — E785 Hyperlipidemia, unspecified: Secondary | ICD-10-CM | POA: Diagnosis not present

## 2019-07-09 DIAGNOSIS — E1169 Type 2 diabetes mellitus with other specified complication: Secondary | ICD-10-CM | POA: Diagnosis not present

## 2019-07-09 DIAGNOSIS — R300 Dysuria: Secondary | ICD-10-CM | POA: Diagnosis not present

## 2019-07-09 DIAGNOSIS — E559 Vitamin D deficiency, unspecified: Secondary | ICD-10-CM | POA: Diagnosis not present

## 2019-07-09 MED FILL — ROSUVASTATIN CALCIUM 5 MG T: 5 | 30 days supply | Qty: 30 | Fill #0

## 2019-07-10 DIAGNOSIS — R300 Dysuria: Secondary | ICD-10-CM | POA: Diagnosis not present

## 2019-07-10 DIAGNOSIS — E559 Vitamin D deficiency, unspecified: Secondary | ICD-10-CM | POA: Diagnosis not present

## 2019-07-10 DIAGNOSIS — I10 Essential (primary) hypertension: Secondary | ICD-10-CM | POA: Diagnosis not present

## 2019-08-05 MED FILL — AMLODIPINE BESYLATE 5 MG TA: 5 | 90 days supply | Qty: 90 | Fill #0

## 2019-08-06 MED FILL — ROSUVASTATIN CALCIUM 5 MG T: 5 | 90 days supply | Qty: 90 | Fill #0

## 2019-08-22 DIAGNOSIS — E785 Hyperlipidemia, unspecified: Secondary | ICD-10-CM | POA: Diagnosis not present

## 2019-08-29 DIAGNOSIS — J3089 Other allergic rhinitis: Secondary | ICD-10-CM | POA: Diagnosis not present

## 2019-08-29 DIAGNOSIS — H1045 Other chronic allergic conjunctivitis: Secondary | ICD-10-CM | POA: Diagnosis not present

## 2019-08-29 DIAGNOSIS — J301 Allergic rhinitis due to pollen: Secondary | ICD-10-CM | POA: Diagnosis not present

## 2019-08-29 MED FILL — MONTELUKAST SOD 10 MG TAB: 10 | 30 days supply | Qty: 30 | Fill #0

## 2019-08-29 MED FILL — FLUTICASONE PROP 50 MCG SPR: 50 | 60 days supply | Qty: 16 | Fill #0

## 2019-11-07 MED FILL — AMLODIPINE BESYLATE 5 MG TA: 5 | 90 days supply | Qty: 90 | Fill #1

## 2020-01-08 DIAGNOSIS — E785 Hyperlipidemia, unspecified: Secondary | ICD-10-CM | POA: Diagnosis not present

## 2020-01-08 DIAGNOSIS — E1169 Type 2 diabetes mellitus with other specified complication: Secondary | ICD-10-CM | POA: Diagnosis not present

## 2020-01-08 DIAGNOSIS — E559 Vitamin D deficiency, unspecified: Secondary | ICD-10-CM | POA: Diagnosis not present

## 2020-01-08 DIAGNOSIS — J309 Allergic rhinitis, unspecified: Secondary | ICD-10-CM | POA: Diagnosis not present

## 2020-01-08 DIAGNOSIS — I10 Essential (primary) hypertension: Secondary | ICD-10-CM | POA: Diagnosis not present

## 2020-01-08 MED FILL — ROSUVASTATIN CALCIUM 5 MG T: 5 | 90 days supply | Qty: 90 | Fill #0

## 2020-02-07 DIAGNOSIS — D72819 Decreased white blood cell count, unspecified: Secondary | ICD-10-CM | POA: Diagnosis not present

## 2020-02-07 MED FILL — AMLODIPINE BESYLATE 5 MG TA: 5 | 90 days supply | Qty: 90 | Fill #0

## 2020-05-28 MED FILL — ROSUVASTATIN CALCIUM 5 MG T: 5 | 90 days supply | Qty: 90 | Fill #1

## 2020-06-24 ENCOUNTER — Other Ambulatory Visit: Payer: Self-pay | Admitting: Physician Assistant

## 2020-06-24 ENCOUNTER — Other Ambulatory Visit (HOSPITAL_COMMUNITY)
Admission: RE | Admit: 2020-06-24 | Discharge: 2020-06-24 | Disposition: A | Discharge status: Home / Self Care | Payer: 59 | Source: Ambulatory Visit | Attending: Physician Assistant | Admitting: Physician Assistant

## 2020-06-24 DIAGNOSIS — D72819 Decreased white blood cell count, unspecified: Secondary | ICD-10-CM | POA: Diagnosis not present

## 2020-06-24 DIAGNOSIS — R238 Other skin changes: Secondary | ICD-10-CM | POA: Diagnosis not present

## 2020-06-24 DIAGNOSIS — Z124 Encounter for screening for malignant neoplasm of cervix: Secondary | ICD-10-CM | POA: Diagnosis not present

## 2020-06-24 DIAGNOSIS — I10 Essential (primary) hypertension: Secondary | ICD-10-CM | POA: Diagnosis not present

## 2020-06-24 DIAGNOSIS — Z0000 Encounter for general adult medical examination without abnormal findings: Secondary | ICD-10-CM | POA: Diagnosis not present

## 2020-06-24 DIAGNOSIS — Z23 Encounter for immunization: Secondary | ICD-10-CM | POA: Diagnosis not present

## 2020-06-24 DIAGNOSIS — E785 Hyperlipidemia, unspecified: Secondary | ICD-10-CM | POA: Diagnosis not present

## 2020-06-24 DIAGNOSIS — E1169 Type 2 diabetes mellitus with other specified complication: Secondary | ICD-10-CM | POA: Diagnosis not present

## 2020-06-26 LAB — CYTOLOGY - PAP
Comment: NEGATIVE
Diagnosis: NEGATIVE
High risk HPV: NEGATIVE

## 2020-07-02 DIAGNOSIS — H5203 Hypermetropia, bilateral: Secondary | ICD-10-CM | POA: Diagnosis not present

## 2020-08-14 MED FILL — AMLODIPINE BESYLATE 5 MG TA: 5 | 90 days supply | Qty: 90 | Fill #0

## 2020-08-27 MED FILL — ROSUVASTATIN CALCIUM 5 MG T: 5 | 90 days supply | Qty: 90 | Fill #0

## 2020-11-19 MED FILL — AMLODIPINE BESYLATE 5 MG TA: 5 | 90 days supply | Qty: 90 | Fill #1

## 2020-12-30 ENCOUNTER — Other Ambulatory Visit (HOSPITAL_COMMUNITY): Payer: Self-pay

## 2020-12-30 DIAGNOSIS — I10 Essential (primary) hypertension: Secondary | ICD-10-CM | POA: Diagnosis not present

## 2020-12-30 DIAGNOSIS — E1169 Type 2 diabetes mellitus with other specified complication: Secondary | ICD-10-CM | POA: Diagnosis not present

## 2020-12-30 DIAGNOSIS — E559 Vitamin D deficiency, unspecified: Secondary | ICD-10-CM | POA: Diagnosis not present

## 2020-12-30 DIAGNOSIS — J309 Allergic rhinitis, unspecified: Secondary | ICD-10-CM | POA: Diagnosis not present

## 2020-12-30 DIAGNOSIS — R2242 Localized swelling, mass and lump, left lower limb: Secondary | ICD-10-CM | POA: Diagnosis not present

## 2020-12-30 DIAGNOSIS — E785 Hyperlipidemia, unspecified: Secondary | ICD-10-CM | POA: Diagnosis not present

## 2020-12-30 MED ORDER — FLUTICASONE PROPIONATE 50 MCG/ACT NA SUSP
NASAL | 3 refills | Status: AC
  Filled 2020-12-30: qty 16, 60d supply, fill #0

## 2020-12-30 MED ORDER — AMLODIPINE BESYLATE 5 MG PO TABS
ORAL_TABLET | ORAL | 1 refills | Status: DC
  Filled 2020-12-30: qty 90, 90d supply, fill #0

## 2020-12-30 MED ORDER — ROSUVASTATIN CALCIUM 5 MG PO TABS
5.0000 mg | ORAL_TABLET | Freq: Every day | ORAL | 1 refills | Status: DC
  Filled 2020-12-30: qty 90, 90d supply, fill #0

## 2021-02-22 ENCOUNTER — Other Ambulatory Visit (HOSPITAL_COMMUNITY): Payer: Self-pay

## 2021-02-23 ENCOUNTER — Other Ambulatory Visit (HOSPITAL_COMMUNITY): Payer: Self-pay

## 2021-02-23 MED ORDER — AMLODIPINE BESYLATE 5 MG PO TABS
ORAL_TABLET | ORAL | 1 refills | Status: DC
  Filled 2021-02-23: qty 90, 90d supply, fill #0
  Filled 2021-05-26: qty 90, 90d supply, fill #1

## 2021-03-04 ENCOUNTER — Other Ambulatory Visit (HOSPITAL_COMMUNITY): Payer: Self-pay

## 2021-03-05 ENCOUNTER — Other Ambulatory Visit (HOSPITAL_COMMUNITY): Payer: Self-pay

## 2021-03-05 MED ORDER — ROSUVASTATIN CALCIUM 5 MG PO TABS
ORAL_TABLET | ORAL | 1 refills | Status: DC
  Filled 2021-03-05: qty 90, 90d supply, fill #0
  Filled 2021-06-11: qty 90, 90d supply, fill #1

## 2021-04-17 ENCOUNTER — Emergency Department (HOSPITAL_COMMUNITY): Payer: 59

## 2021-04-17 ENCOUNTER — Other Ambulatory Visit: Payer: Self-pay

## 2021-04-17 ENCOUNTER — Emergency Department (HOSPITAL_COMMUNITY)
Admission: EM | Admit: 2021-04-17 | Discharge: 2021-04-17 | Disposition: A | Discharge status: Home / Self Care | Payer: 59 | Attending: Student | Admitting: Student

## 2021-04-17 ENCOUNTER — Encounter (HOSPITAL_COMMUNITY): Payer: Self-pay | Admitting: Emergency Medicine

## 2021-04-17 DIAGNOSIS — R112 Nausea with vomiting, unspecified: Secondary | ICD-10-CM | POA: Diagnosis not present

## 2021-04-17 DIAGNOSIS — Z79899 Other long term (current) drug therapy: Secondary | ICD-10-CM | POA: Insufficient documentation

## 2021-04-17 DIAGNOSIS — I10 Essential (primary) hypertension: Secondary | ICD-10-CM | POA: Diagnosis not present

## 2021-04-17 DIAGNOSIS — R519 Headache, unspecified: Secondary | ICD-10-CM | POA: Diagnosis not present

## 2021-04-17 DIAGNOSIS — E1169 Type 2 diabetes mellitus with other specified complication: Secondary | ICD-10-CM | POA: Diagnosis not present

## 2021-04-17 DIAGNOSIS — R42 Dizziness and giddiness: Secondary | ICD-10-CM | POA: Diagnosis not present

## 2021-04-17 DIAGNOSIS — H81392 Other peripheral vertigo, left ear: Secondary | ICD-10-CM | POA: Insufficient documentation

## 2021-04-17 DIAGNOSIS — E785 Hyperlipidemia, unspecified: Secondary | ICD-10-CM | POA: Diagnosis not present

## 2021-04-17 LAB — COMPREHENSIVE METABOLIC PANEL
ALT: 20 U/L (ref 0–44)
AST: 18 U/L (ref 15–41)
Albumin: 4.6 g/dL (ref 3.5–5.0)
Alkaline Phosphatase: 50 U/L (ref 38–126)
Anion gap: 8 (ref 5–15)
BUN: 11 mg/dL (ref 8–23)
CO2: 26 mmol/L (ref 22–32)
Calcium: 9.8 mg/dL (ref 8.9–10.3)
Chloride: 103 mmol/L (ref 98–111)
Creatinine, Ser: 0.68 mg/dL (ref 0.44–1.00)
GFR, Estimated: >60 mL/min (ref >60)
Glucose, Bld: 106 mg/dL — ABNORMAL HIGH (ref 70–99)
Potassium: 4.2 mmol/L (ref 3.5–5.1)
Sodium: 137 mmol/L (ref 135–145)
Total Bilirubin: 1 mg/dL (ref 0.3–1.2)
Total Protein: 8.4 g/dL — ABNORMAL HIGH (ref 6.5–8.1)

## 2021-04-17 LAB — URINALYSIS, ROUTINE W REFLEX MICROSCOPIC
Bilirubin Urine: NEGATIVE
Glucose, UA: NEGATIVE mg/dL
Hgb urine dipstick: NEGATIVE
Ketones, ur: 20 mg/dL — AB
Leukocytes,Ua: NEGATIVE
Nitrite: NEGATIVE
Protein, ur: NEGATIVE mg/dL
Specific Gravity, Urine: 1.009 (ref 1.005–1.030)
pH: 8 (ref 5.0–8.0)

## 2021-04-17 LAB — CBC WITH DIFFERENTIAL/PLATELET
Abs Immature Granulocytes: 0.01 10*3/uL (ref 0.00–0.07)
Basophils Absolute: 0 10*3/uL (ref 0.0–0.1)
Basophils Relative: 0 %
Eosinophils Absolute: 0 10*3/uL (ref 0.0–0.5)
Eosinophils Relative: 0 %
HCT: 45.5 % (ref 36.0–46.0)
Hemoglobin: 14.4 g/dL (ref 12.0–15.0)
Immature Granulocytes: 0 %
Lymphocytes Relative: 22 %
Lymphs Abs: 1.1 10*3/uL (ref 0.7–4.0)
MCH: 28.6 pg (ref 26.0–34.0)
MCHC: 31.6 g/dL (ref 30.0–36.0)
MCV: 90.3 fL (ref 80.0–100.0)
Monocytes Absolute: 0.2 10*3/uL (ref 0.1–1.0)
Monocytes Relative: 5 %
Neutro Abs: 3.4 10*3/uL (ref 1.7–7.7)
Neutrophils Relative %: 73 %
Platelets: 242 10*3/uL (ref 150–400)
RBC: 5.04 MIL/uL (ref 3.87–5.11)
RDW: 13.2 % (ref 11.5–15.5)
WBC: 4.8 10*3/uL (ref 4.0–10.5)
nRBC: 0 % (ref 0.0–0.2)

## 2021-04-17 LAB — LIPASE, BLOOD: Lipase: 62 U/L — ABNORMAL HIGH (ref 11–51)

## 2021-04-17 MED ORDER — ONDANSETRON 4 MG PO TBDP
4.0000 mg | ORAL_TABLET | Freq: Once | ORAL | Status: DC | PRN
  Filled 2021-04-17: qty 1

## 2021-04-17 MED ORDER — MECLIZINE HCL 25 MG PO TABS: 25.0000 mg | ORAL_TABLET | Freq: Three times a day (TID) | ORAL | 0 refills | Status: AC | PRN

## 2021-04-17 MED ORDER — MECLIZINE HCL 25 MG PO TABS
25.0000 mg | ORAL_TABLET | Freq: Once | ORAL | Status: AC
  Administered 2021-04-17: 25 mg via ORAL
  Filled 2021-04-17: qty 1

## 2021-04-17 NOTE — ED Provider Notes (Signed)
Emergency Medicine Provider Triage Evaluation Note  Bianca Robinson , a 60 y.o. female  was evaluated in triage.  Pt complains of headache, dizziness, paresthesia right arm.  Patient reports that she woke up at 0 400 this morning with these symptoms.  Patient went to bed 2200 last night without any symptoms.  Headache is located to the top of her head.  Patient endorses occasional black spots crossing her vision.  Denies any facial asymmetry, slurred speech, weakness, numbness..  Patient endorses right arm feeling "heavy."  No recent falls or injuries.  Review of Systems  Positive: Headache, dizziness, paresthesia right arm, visual disturbance Negative: Chest pain, shortness of breath, abdominal pain, numbness, weakness, facial asymmetry, slurred speech  Physical Exam  BP (!) 164/86   Pulse 82   Temp 98.2 F (36.8 C)   Resp (!) 30   SpO2 100%  Gen:   Awake, no distress   Resp:  Normal effort  MSK:   Moves extremities without difficulty  Other:  CN II through XII intact.  +5 strength to bilateral upper and lower extremities.  Sensation to light touch intact to bilateral upper and lower extremities.  Medical Decision Making  Medically screening exam initiated at 2:36 PM.  Appropriate orders placed.  Maysoon Swafford was informed that the remainder of the evaluation will be completed by another provider, this initial triage assessment does not replace that evaluation, and the importance of remaining in the ED until their evaluation is complete.  The patient appears stable so that the remainder of the work up may be completed by another provider.      Loni Beckwith, PA-C 04/17/21 1438    Carmin Muskrat, MD 04/17/21 1623

## 2021-04-17 NOTE — ED Triage Notes (Signed)
Pt POV with son- Son reports pt woke up this AM (0400) with dizziness, nausea, parasthesia in right arm, severe headache.pt reports headache started today.  Son reports intermittent confusion since yesterday.

## 2021-04-17 NOTE — ED Notes (Signed)
Pt states nausea has resolved although headache still present.  Pt is c/o what feels like fluid in her left ear which she thinks may be the cause of her symptoms.  Provider notified

## 2021-04-17 NOTE — ED Provider Notes (Signed)
[place] DEPT Provider Note   CSN: [id number] Arrival date & time: 04/17/21  1341     History Chief Complaint  Patient presents with   Dizziness   Emesis    Tyshell Bruck is a 60 y.o. female with PMH anemia, DM, GERD, HLD, HTN, previous BPPV who presents to the emergency department for evaluation of dizziness and vertigo.  Patient states that she awoke this morning with significant vertigo worse when turning her head to the left and right.  She states that her son is in medical school and let her through a series of Epley maneuvers that exacerbated her vertigo.  She then presents via EMS to rule out CVA due to persistent vertigo.  On my initial examination, the patient's symptoms have resolved and she currently complains of a mild 3 out of 10 posterior headache but denies persistent vertigo, nausea.  Denies numbness, tingling, weakness or any other systemic or neurologic complaints.   Dizziness Associated symptoms: nausea and vomiting   Associated symptoms: no chest pain, no palpitations and no shortness of breath   Emesis Associated symptoms: no abdominal pain, no arthralgias, no chills, no cough, no fever and no sore throat       Past Medical History:  Diagnosis Date   Allergy    has a lot allergies/gets allergy shot weekly   Anemia    Diabetes mellitus without complication (HCC)    no meds/diet control   GERD (gastroesophageal reflux disease)    Hyperlipidemia    Hypertension     There are no problems to display for this patient.   Past Surgical History:  Procedure Laterality Date   CESAREAN SECTION     1 time   HAND SURGERY  2014   right hand/ removed blood clot     OB History   No obstetric history on file.     Family History  Problem Relation Age of Onset   Liver disease Mother     Social History   Tobacco Use   Smoking status: Never   Smokeless tobacco: Never  Substance Use Topics   Alcohol use: No   Drug use: No     Home Medications Prior to Admission medications   Medication Sig Start Date End Date Taking? Authorizing Provider  amLODipine (NORVASC) 5 MG tablet Take 5 mg by mouth daily.    [provider]  amLODipine (NORVASC) 5 MG tablet TAKE 1 TABLET BY MOUTH ONCE DAILY 06/24/20 06/24/21  Scifres, Dorothy, PA-C  amLODipine (NORVASC) 5 MG tablet 1 tablet Orally Once a day. 12/30/20     amLODipine (NORVASC) 5 MG tablet Take 1 tablet by mouth once a day 02/23/21     b complex vitamins tablet Take 1 tablet by mouth daily.    [provider]  Biotin 5000 MCG TABS Take by mouth daily.    [provider]  cholecalciferol (VITAMIN D) 1000 units tablet Take 2,000 Units by mouth daily.    [provider]  docusate sodium (COLACE) 100 MG capsule Take 1 capsule (100 mg total) by mouth every 12 (twelve) hours. Patient not taking: Reported on 08/08/2017 04/09/14   Noemi Chapel, MD  fluticasone [facility]) 50 MCG/ACT nasal spray Use 1 spray into both nostrils daily 12/30/20     HYDROcodone-acetaminophen (NORCO/VICODIN) 5-325 MG per tablet Take 2 tablets by mouth every 4 (four) hours as needed. Patient not taking: Reported on 08/08/2017 04/09/14   Noemi Chapel, MD  naproxen (NAPROSYN) 500 MG tablet Take  1 tablet (500 mg total) by mouth 2 (two) times daily with a meal. Patient not taking: Reported on 08/08/2017 04/09/14   Noemi Chapel, MD  NON FORMULARY Chili,Matcha and Green tea in one bag-Mix in warm water    [provider]  Omega-3 Fatty Acids (SALMON OIL PO) Take 1,000 mg by mouth daily.    [provider]  PRESCRIPTION MEDICATION Allergy shots- every week at [facility]    [provider]  rosuvastatin (CRESTOR) 5 MG tablet TAKE 1 TABLET BY MOUTH ONCE DAILY 06/24/20 06/24/21  Scifres, Earlie Server, PA-C  rosuvastatin (CRESTOR) 5 MG tablet Take 1 tablet (5 mg total) by mouth daily. 12/30/20     rosuvastatin (CRESTOR) 5 MG tablet Take 1 tablet by mouth once a day  03/05/21     TURMERIC PO Take by mouth daily.    [provider]  vitamin C (ASCORBIC ACID) 500 MG tablet Take 500 mg by mouth daily.    [provider]    Allergies    Shellfish allergy  Review of Systems   Review of Systems  Constitutional:  Negative for chills and fever.  HENT:  Negative for ear pain and sore throat.   Eyes:  Negative for pain and visual disturbance.  Respiratory:  Negative for cough and shortness of breath.   Cardiovascular:  Negative for chest pain and palpitations.  Gastrointestinal:  Positive for nausea and vomiting. Negative for abdominal pain.  Genitourinary:  Negative for dysuria and hematuria.  Musculoskeletal:  Negative for arthralgias and back pain.  Skin:  Negative for color change and rash.  Neurological:  Positive for dizziness. Negative for seizures and syncope.  All other systems reviewed and are negative.  Physical Exam Updated Vital Signs BP 137/72 (BP Location: Right Arm)   Pulse 65   Temp 98.1 F (36.7 C) (Oral)   Resp (!) 22   SpO2 100%   Physical Exam Vitals and nursing note reviewed.  Constitutional:      General: She is not in acute distress.    Appearance: She is well-developed.  HENT:     Head: Normocephalic and atraumatic.     Right Ear: Tympanic membrane normal.     Left Ear: Tympanic membrane normal.  Eyes:     Conjunctiva/sclera: Conjunctivae normal.  Cardiovascular:     Rate and Rhythm: Normal rate and regular rhythm.     Heart sounds: No murmur heard. Pulmonary:     Effort: Pulmonary effort is normal. No respiratory distress.     Breath sounds: Normal breath sounds.  Abdominal:     Palpations: Abdomen is soft.     Tenderness: There is no abdominal tenderness.  Musculoskeletal:     Cervical back: Neck supple.  Skin:    General: Skin is warm and dry.  Neurological:     General: No focal deficit present.     Mental Status: She is alert.     Cranial Nerves: No cranial nerve deficit.     Sensory:  No sensory deficit.     Motor: No weakness.     Comments: No persistent nystagmus on EOM    ED Results / Procedures / Treatments   Labs (all labs ordered are listed, but only abnormal results are displayed) Labs Reviewed  LIPASE, BLOOD - Abnormal; Notable for the following components:      Result Value   Lipase 62 (*)    All other components within normal limits  COMPREHENSIVE METABOLIC PANEL - Abnormal; Notable for the following  components:   Glucose, Bld 106 (*)    Total Protein 8.4 (*)    All other components within normal limits  URINALYSIS, ROUTINE W REFLEX MICROSCOPIC - Abnormal; Notable for the following components:   Ketones, ur 20 (*)    Bacteria, UA RARE (*)    All other components within normal limits  CBC WITH DIFFERENTIAL/PLATELET    EKG None  Radiology CT HEAD WO CONTRAST (5MM)  Result Date: 04/17/2021 CLINICAL DATA:  Neuro deficit, acute, stroke suspected. Headache and dizziness. EXAM: CT HEAD WITHOUT CONTRAST TECHNIQUE: Contiguous axial images were obtained from the base of the skull through the vertex without intravenous contrast. COMPARISON:  None. FINDINGS: Brain: No evidence for acute hemorrhage, mass lesion, midline shift, hydrocephalus or large infarct. Vascular: No hyperdense vessel or unexpected calcification. Skull: Normal. Negative for fracture or focal lesion. Sinuses/Orbits: Small amount of fluid or mucosal thickening in the left sphenoid sinus. Minimal mucosal disease in the right maxillary sinus. Other: None. IMPRESSION: No acute intracranial abnormality. Electronically Signed   By: Markus Daft M.D.   On: 04/17/2021 15:25    Procedures Procedures   Medications Ordered in ED Medications  ondansetron (ZOFRAN-ODT) disintegrating tablet 4 mg (has no administration in time range)    ED Course  I have reviewed the triage vital signs and the nursing notes.  Pertinent labs & imaging results that were available during my care of the patient were reviewed  by me and considered in my medical decision making (see chart for details).  Clinical Course as of 04/17/21 2313  Sat Apr 17, 2021  2048 Calcium: 9.8 [MK]    Clinical Course User Index [MK] Darneshia Demary, Debe Coder, MD   MDM Rules/Calculators/A&P                           Patient seen in the emergency department for evaluation of vertigo.  Physical exam is unremarkable with no focal motor or sensory deficits, no cranial nerve deficits, no nystagmus with extraocular movements.  Patient able to range her neck fully with no persistent vertigo.  TMs normal bilaterally.  Evaluation unremarkable outside of a very mild lipase elevation to 62.  CT head unremarkable.  Patient presentation consistent with peripheral vertigo and benign positional paroxysmal vertigo.  She was given 25 mg meclizine here in the emergency department and provided with a prescription for meclizine.  She will call her primary care physician on Monday to discuss ENT follow-up and a dizziness clinic as this is not her first presentation of BPPV.  Using shared decision-making, patient agreed to be discharged at this time with primary care follow-up and she was discharged. Final Clinical Impression(s) / ED Diagnoses Final diagnoses:  None    Rx / DC Orders ED Discharge Orders     None        Taquita Demby, Debe Coder, MD 04/17/21 2318

## 2021-04-19 ENCOUNTER — Other Ambulatory Visit (HOSPITAL_COMMUNITY): Payer: Self-pay

## 2021-04-19 MED ORDER — MECLIZINE HCL 25 MG PO TABS
ORAL_TABLET | ORAL | 0 refills | Status: AC
  Filled 2021-04-19: qty 30, 10d supply, fill #0

## 2021-05-27 ENCOUNTER — Other Ambulatory Visit (HOSPITAL_COMMUNITY): Payer: Self-pay

## 2021-06-11 ENCOUNTER — Other Ambulatory Visit (HOSPITAL_COMMUNITY): Payer: Self-pay

## 2021-06-30 ENCOUNTER — Other Ambulatory Visit (HOSPITAL_COMMUNITY): Payer: Self-pay

## 2021-06-30 DIAGNOSIS — K219 Gastro-esophageal reflux disease without esophagitis: Secondary | ICD-10-CM | POA: Diagnosis not present

## 2021-06-30 DIAGNOSIS — Z0000 Encounter for general adult medical examination without abnormal findings: Secondary | ICD-10-CM | POA: Diagnosis not present

## 2021-06-30 DIAGNOSIS — E785 Hyperlipidemia, unspecified: Secondary | ICD-10-CM | POA: Diagnosis not present

## 2021-06-30 DIAGNOSIS — L723 Sebaceous cyst: Secondary | ICD-10-CM | POA: Diagnosis not present

## 2021-06-30 DIAGNOSIS — L089 Local infection of the skin and subcutaneous tissue, unspecified: Secondary | ICD-10-CM | POA: Diagnosis not present

## 2021-06-30 DIAGNOSIS — E1169 Type 2 diabetes mellitus with other specified complication: Secondary | ICD-10-CM | POA: Diagnosis not present

## 2021-06-30 DIAGNOSIS — F329 Major depressive disorder, single episode, unspecified: Secondary | ICD-10-CM | POA: Diagnosis not present

## 2021-06-30 DIAGNOSIS — Z23 Encounter for immunization: Secondary | ICD-10-CM | POA: Diagnosis not present

## 2021-06-30 DIAGNOSIS — I10 Essential (primary) hypertension: Secondary | ICD-10-CM | POA: Diagnosis not present

## 2021-06-30 MED ORDER — ROSUVASTATIN CALCIUM 5 MG PO TABS
5.0000 mg | ORAL_TABLET | Freq: Every day | ORAL | 1 refills | Status: DC
  Filled 2021-06-30 – 2021-09-01 (×2): qty 90, 90d supply, fill #0
  Filled 2021-12-15: qty 90, 90d supply, fill #1

## 2021-06-30 MED ORDER — SULFAMETHOXAZOLE-TRIMETHOPRIM 800-160 MG PO TABS
ORAL_TABLET | ORAL | 0 refills | Status: AC
  Filled 2021-06-30: qty 14, 7d supply, fill #0

## 2021-06-30 MED ORDER — AMLODIPINE BESYLATE 5 MG PO TABS
5.0000 mg | ORAL_TABLET | Freq: Every day | ORAL | 1 refills | Status: DC
  Filled 2021-06-30 – 2021-09-01 (×2): qty 90, 90d supply, fill #0
  Filled 2021-12-07: qty 90, 90d supply, fill #1

## 2021-07-07 DIAGNOSIS — Z23 Encounter for immunization: Secondary | ICD-10-CM | POA: Diagnosis not present

## 2021-08-23 DIAGNOSIS — Z23 Encounter for immunization: Secondary | ICD-10-CM | POA: Diagnosis not present

## 2021-08-31 ENCOUNTER — Other Ambulatory Visit (HOSPITAL_COMMUNITY): Payer: Self-pay

## 2021-09-01 ENCOUNTER — Other Ambulatory Visit (HOSPITAL_COMMUNITY): Payer: Self-pay

## 2021-09-02 ENCOUNTER — Other Ambulatory Visit (HOSPITAL_COMMUNITY): Payer: Self-pay

## 2021-12-07 ENCOUNTER — Other Ambulatory Visit (HOSPITAL_COMMUNITY): Payer: Self-pay

## 2021-12-16 ENCOUNTER — Other Ambulatory Visit (HOSPITAL_COMMUNITY): Payer: Self-pay

## 2022-03-02 ENCOUNTER — Other Ambulatory Visit (HOSPITAL_COMMUNITY): Payer: Self-pay

## 2022-03-03 ENCOUNTER — Other Ambulatory Visit (HOSPITAL_COMMUNITY): Payer: Self-pay

## 2022-03-03 MED ORDER — AMLODIPINE BESYLATE 5 MG PO TABS
ORAL_TABLET | ORAL | 0 refills | Status: DC
  Filled 2022-03-03: qty 90, 90d supply, fill #0

## 2022-03-04 ENCOUNTER — Other Ambulatory Visit (HOSPITAL_COMMUNITY): Payer: Self-pay

## 2022-03-17 ENCOUNTER — Other Ambulatory Visit (HOSPITAL_COMMUNITY): Payer: Self-pay

## 2022-03-18 ENCOUNTER — Other Ambulatory Visit (HOSPITAL_COMMUNITY): Payer: Self-pay

## 2022-03-21 ENCOUNTER — Other Ambulatory Visit (HOSPITAL_COMMUNITY): Payer: Self-pay

## 2022-03-21 MED ORDER — AMLODIPINE BESYLATE 5 MG PO TABS
ORAL_TABLET | ORAL | 0 refills | Status: DC
  Filled 2022-03-21: qty 90, 90d supply, fill #0

## 2022-03-21 MED ORDER — ROSUVASTATIN CALCIUM 5 MG PO TABS
ORAL_TABLET | ORAL | 1 refills | Status: DC
  Filled 2022-03-21: qty 90, 90d supply, fill #0
  Filled 2022-06-17: qty 90, 90d supply, fill #1

## 2022-03-25 ENCOUNTER — Other Ambulatory Visit (HOSPITAL_COMMUNITY): Payer: Self-pay

## 2022-06-09 ENCOUNTER — Other Ambulatory Visit (HOSPITAL_COMMUNITY): Payer: Self-pay

## 2022-06-09 MED ORDER — AMLODIPINE BESYLATE 5 MG PO TABS
5.0000 mg | ORAL_TABLET | Freq: Every day | ORAL | 0 refills | Status: DC
  Filled 2022-06-09: qty 90, 90d supply, fill #0

## 2022-06-17 ENCOUNTER — Other Ambulatory Visit (HOSPITAL_COMMUNITY): Payer: Self-pay

## 2022-08-30 DIAGNOSIS — H5203 Hypermetropia, bilateral: Secondary | ICD-10-CM | POA: Diagnosis not present

## 2022-09-23 ENCOUNTER — Other Ambulatory Visit (HOSPITAL_COMMUNITY): Payer: Self-pay

## 2022-09-25 ENCOUNTER — Other Ambulatory Visit (HOSPITAL_COMMUNITY): Payer: Self-pay

## 2022-09-26 ENCOUNTER — Other Ambulatory Visit (HOSPITAL_COMMUNITY): Payer: Self-pay

## 2022-09-26 MED ORDER — AMLODIPINE BESYLATE 5 MG PO TABS
5.0000 mg | ORAL_TABLET | Freq: Every day | ORAL | 0 refills | Status: DC
  Filled 2022-09-26: qty 90, 90d supply, fill #0

## 2022-09-26 MED ORDER — ROSUVASTATIN CALCIUM 5 MG PO TABS
ORAL_TABLET | ORAL | 0 refills | Status: DC
  Filled 2022-09-26: qty 30, 30d supply, fill #0

## 2022-09-28 ENCOUNTER — Other Ambulatory Visit (HOSPITAL_COMMUNITY): Payer: Self-pay

## 2022-09-28 DIAGNOSIS — H9312 Tinnitus, left ear: Secondary | ICD-10-CM | POA: Diagnosis not present

## 2022-09-28 DIAGNOSIS — J302 Other seasonal allergic rhinitis: Secondary | ICD-10-CM | POA: Diagnosis not present

## 2022-09-28 DIAGNOSIS — Z86010 Personal history of colonic polyps: Secondary | ICD-10-CM | POA: Diagnosis not present

## 2022-09-28 DIAGNOSIS — E663 Overweight: Secondary | ICD-10-CM | POA: Diagnosis not present

## 2022-09-28 DIAGNOSIS — E559 Vitamin D deficiency, unspecified: Secondary | ICD-10-CM | POA: Diagnosis not present

## 2022-09-28 DIAGNOSIS — E7849 Other hyperlipidemia: Secondary | ICD-10-CM | POA: Diagnosis not present

## 2022-09-28 DIAGNOSIS — E119 Type 2 diabetes mellitus without complications: Secondary | ICD-10-CM | POA: Diagnosis not present

## 2022-09-28 DIAGNOSIS — I10 Essential (primary) hypertension: Secondary | ICD-10-CM | POA: Diagnosis not present

## 2022-09-28 DIAGNOSIS — Z1231 Encounter for screening mammogram for malignant neoplasm of breast: Secondary | ICD-10-CM | POA: Diagnosis not present

## 2022-09-28 DIAGNOSIS — Z0000 Encounter for general adult medical examination without abnormal findings: Secondary | ICD-10-CM | POA: Diagnosis not present

## 2022-09-28 MED ORDER — ROSUVASTATIN CALCIUM 5 MG PO TABS
5.0000 mg | ORAL_TABLET | Freq: Every day | ORAL | 0 refills | Status: DC
  Filled 2022-09-28: qty 90, 90d supply, fill #0

## 2022-09-28 MED ORDER — FREESTYLE LANCETS MISC
0 refills | Status: AC
  Filled 2022-09-28: qty 100, 50d supply, fill #0

## 2022-09-28 MED ORDER — AMLODIPINE BESYLATE 5 MG PO TABS
5.0000 mg | ORAL_TABLET | Freq: Every day | ORAL | 1 refills | Status: DC
  Filled 2022-09-28: qty 90, 90d supply, fill #0

## 2022-09-28 MED ORDER — FREESTYLE LITE TEST VI STRP
ORAL_STRIP | 1 refills | Status: AC
  Filled 2022-09-28: qty 100, 50d supply, fill #0

## 2022-10-03 ENCOUNTER — Other Ambulatory Visit: Payer: Self-pay | Admitting: Family Medicine

## 2022-10-03 DIAGNOSIS — Z1231 Encounter for screening mammogram for malignant neoplasm of breast: Secondary | ICD-10-CM

## 2022-11-01 ENCOUNTER — Other Ambulatory Visit (HOSPITAL_COMMUNITY): Payer: Self-pay

## 2022-11-02 ENCOUNTER — Other Ambulatory Visit (HOSPITAL_COMMUNITY): Payer: Self-pay

## 2022-11-02 MED ORDER — ROSUVASTATIN CALCIUM 5 MG PO TABS
5.0000 mg | ORAL_TABLET | Freq: Every day | ORAL | 0 refills | Status: DC
  Filled 2022-11-02: qty 30, 30d supply, fill #0

## 2022-11-27 ENCOUNTER — Other Ambulatory Visit (HOSPITAL_COMMUNITY): Payer: Self-pay

## 2022-11-28 ENCOUNTER — Other Ambulatory Visit (HOSPITAL_COMMUNITY): Payer: Self-pay

## 2022-11-28 MED ORDER — ROSUVASTATIN CALCIUM 5 MG PO TABS
ORAL_TABLET | ORAL | 11 refills | Status: DC
  Filled 2022-11-28: qty 30, 30d supply, fill #0
  Filled 2022-12-25: qty 30, 30d supply, fill #1
  Filled 2023-01-26: qty 30, 30d supply, fill #2
  Filled 2023-02-21: qty 30, 30d supply, fill #3
  Filled 2023-03-26: qty 30, 30d supply, fill #4
  Filled 2023-04-30: qty 30, 30d supply, fill #5
  Filled 2023-06-04: qty 30, 30d supply, fill #6
  Filled 2023-07-04: qty 30, 30d supply, fill #7
  Filled 2023-08-03: qty 30, 30d supply, fill #8
  Filled 2023-09-03: qty 30, 30d supply, fill #9
  Filled 2023-10-08: qty 30, 30d supply, fill #10

## 2022-11-30 ENCOUNTER — Other Ambulatory Visit (HOSPITAL_COMMUNITY): Payer: Self-pay

## 2022-12-12 IMAGING — CT CT HEAD W/O CM
3 series · 15 of 47 positions shown, 18 images · non-contrast
Comparison: None.

CLINICAL DATA: Neuro deficit, acute, stroke suspected. Headache and
dizziness.

EXAM:
CT HEAD WITHOUT CONTRAST
TECHNIQUE: Contiguous axial images were obtained from the base of the skull
through the vertex without intravenous contrast.

[Series 2: head wo · axial · 0.44mm/px · z∈[+770,+910]mm · 9 of 34 slices shown, 12 images]
[im 3/34  brain]
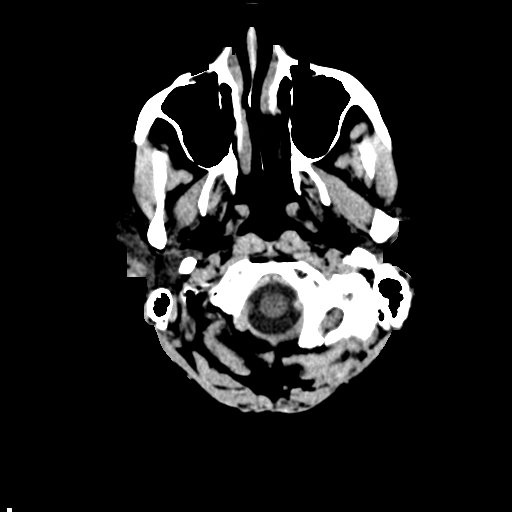
[im 3/34  bone]
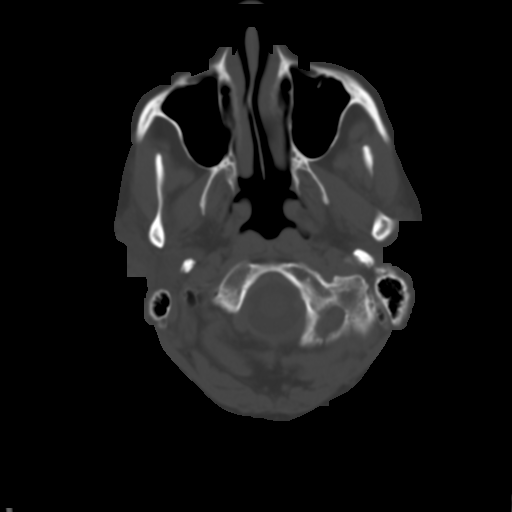
[im 6/34  brain]
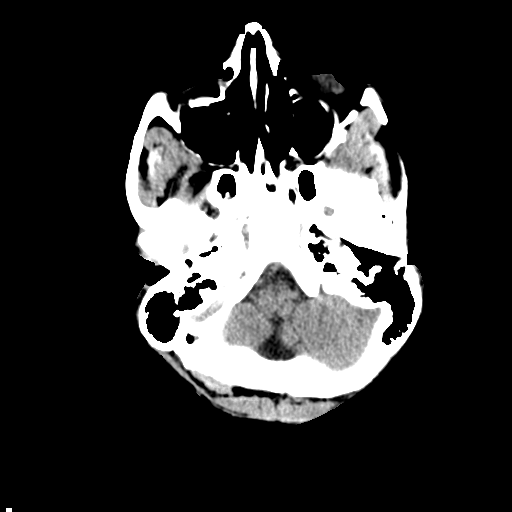
[im 10/34  brain]
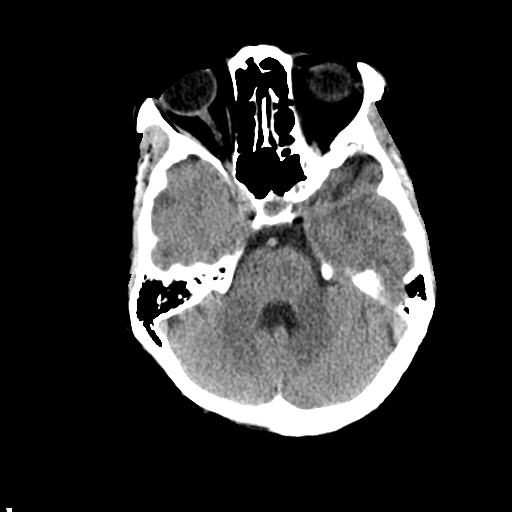
[im 13/34  brain]
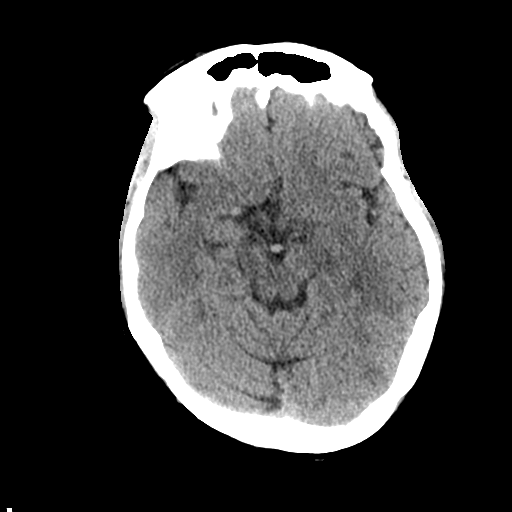
[im 18/34  brain]
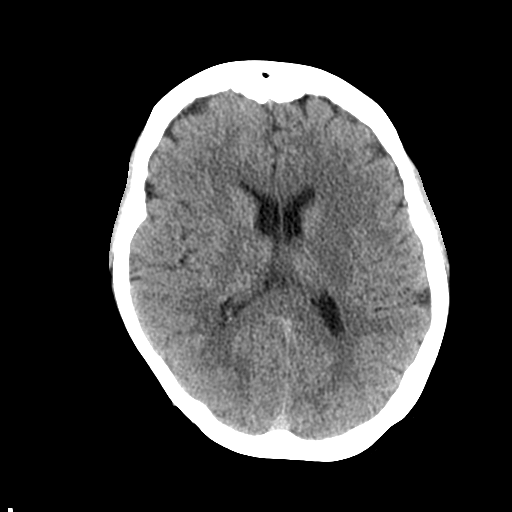
[im 18/34  bone]
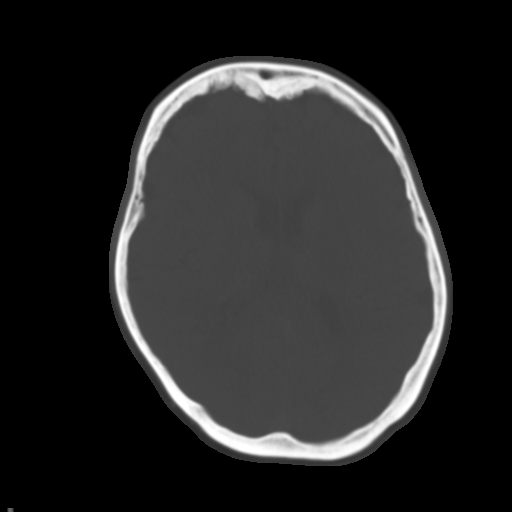
[im 21/34  brain]
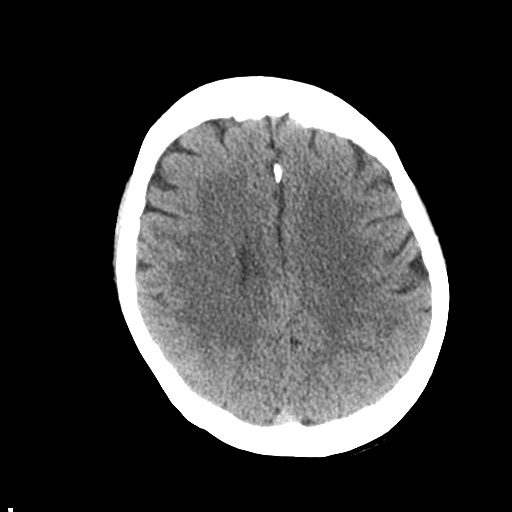
[im 24/34  brain]
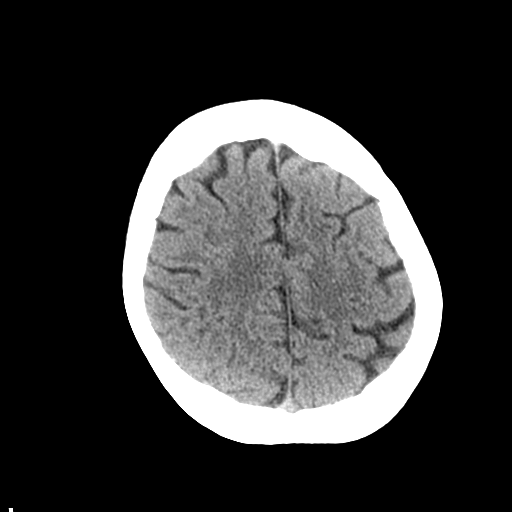
[im 28/34  brain]
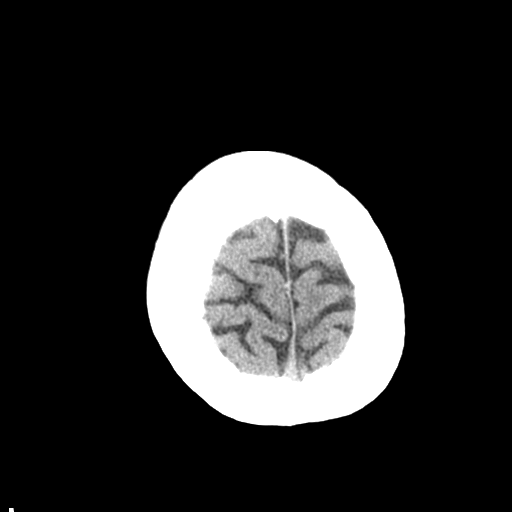
[im 31/34  brain]
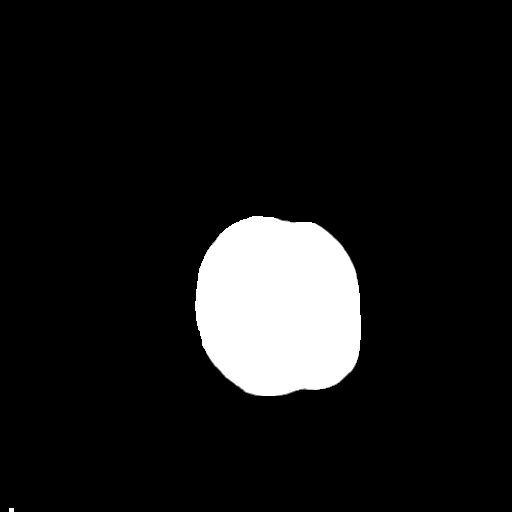
[im 31/34  bone]
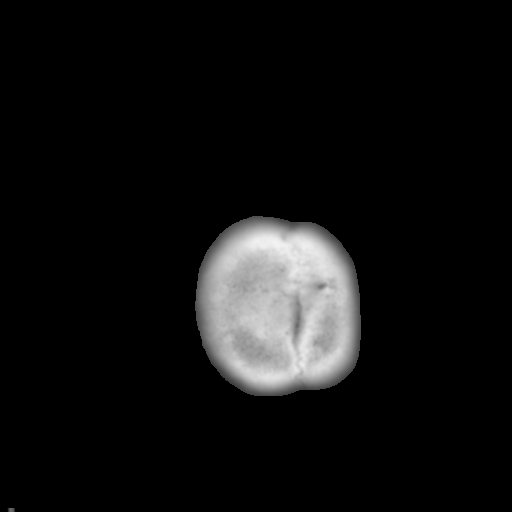

[Series 5: coronal soft tissue · coronal · 0.32mm/px · 3 of 74 slices shown]
[im 25/74  brain]
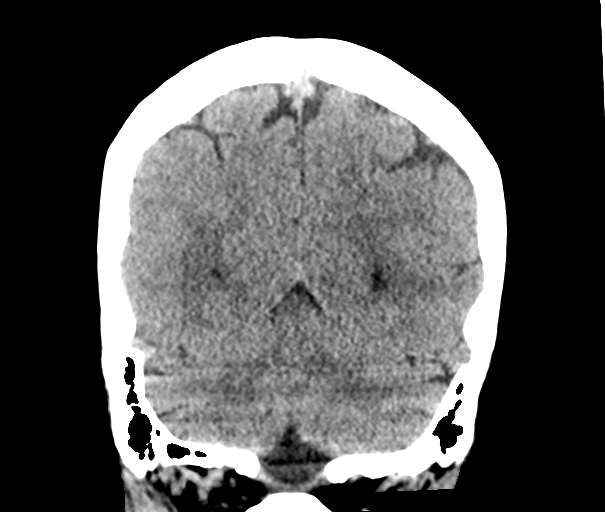
[im 33/74  brain]
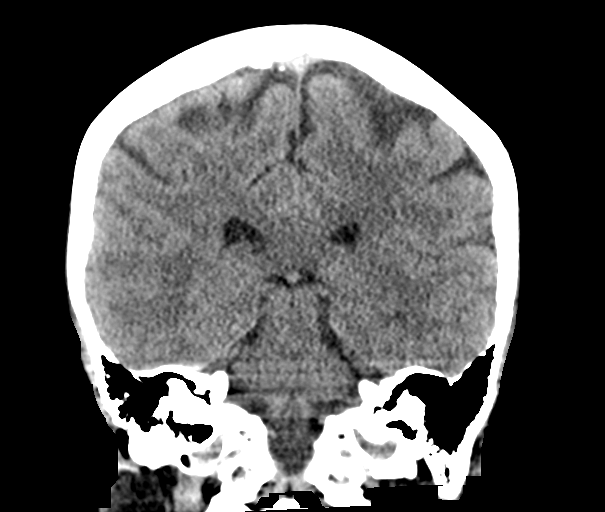
[im 41/74  brain]
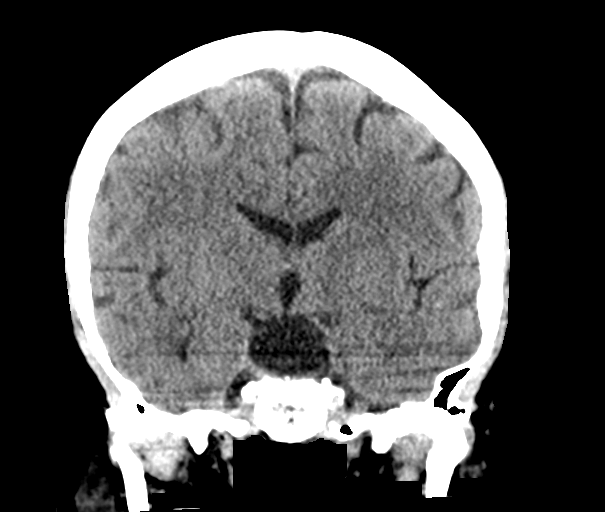

[Series 6: sagittal soft tissue · sagittal · 0.32mm/px · 3 of 59 slices shown]
[im 20/59  brain]
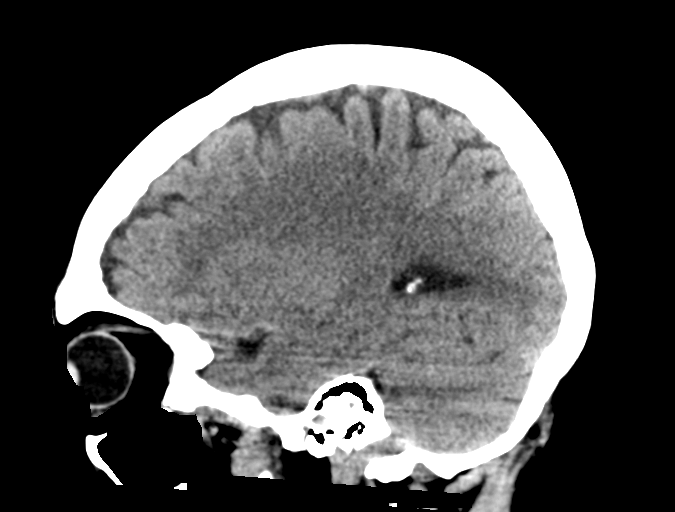
[im 30/59  brain]
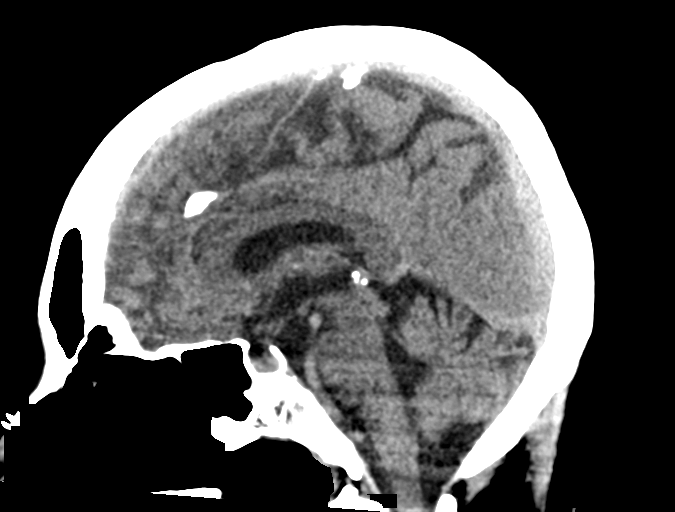
[im 39/59  brain]
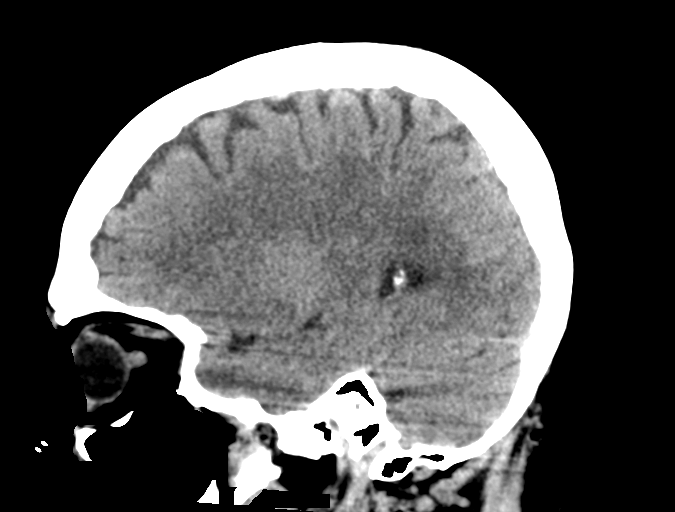

[15 of 47 positions shown; findings below may reference images not displayed]

FINDINGS: Brain: No evidence for acute hemorrhage, mass lesion, midline shift,
hydrocephalus or large infarct.

Vascular: No hyperdense vessel or unexpected calcification.

Skull: Normal. Negative for fracture or focal lesion.

Sinuses/Orbits: Small amount of fluid or mucosal thickening in the
left sphenoid sinus. Minimal mucosal disease in the right maxillary
sinus.

Other: None.
IMPRESSION: No acute intracranial abnormality.

## 2022-12-21 ENCOUNTER — Other Ambulatory Visit (HOSPITAL_COMMUNITY): Payer: Self-pay

## 2022-12-22 ENCOUNTER — Other Ambulatory Visit (HOSPITAL_COMMUNITY): Payer: Self-pay

## 2022-12-22 MED ORDER — AMLODIPINE BESYLATE 5 MG PO TABS
ORAL_TABLET | ORAL | 1 refills | Status: DC
  Filled 2022-12-22: qty 90, 90d supply, fill #0
  Filled 2023-03-26: qty 90, 90d supply, fill #1

## 2022-12-26 ENCOUNTER — Other Ambulatory Visit (HOSPITAL_COMMUNITY): Payer: Self-pay

## 2022-12-29 ENCOUNTER — Ambulatory Visit
Admission: RE | Admit: 2022-12-29 | Discharge: 2022-12-29 | Disposition: A | Discharge status: Home / Self Care | Payer: Commercial Managed Care - PPO | Source: Ambulatory Visit | Attending: Family Medicine | Admitting: Family Medicine

## 2022-12-29 DIAGNOSIS — Z1231 Encounter for screening mammogram for malignant neoplasm of breast: Secondary | ICD-10-CM | POA: Diagnosis not present

## 2023-01-27 ENCOUNTER — Other Ambulatory Visit (HOSPITAL_COMMUNITY): Payer: Self-pay

## 2023-04-13 ENCOUNTER — Other Ambulatory Visit (HOSPITAL_COMMUNITY): Payer: Self-pay

## 2023-04-13 MED ORDER — FREESTYLE LITE W/DEVICE KIT
PACK | 0 refills | Status: AC
  Filled 2023-04-13: qty 1, 30d supply, fill #0

## 2023-04-13 MED ORDER — GLUCOSE BLOOD VI STRP
ORAL_STRIP | 3 refills | Status: AC
  Filled 2023-04-13: qty 100, 50d supply, fill #0

## 2023-04-13 MED ORDER — FREESTYLE LANCETS MISC
3 refills | Status: AC
  Filled 2023-04-13: qty 100, 50d supply, fill #0

## 2023-04-21 DIAGNOSIS — H5203 Hypermetropia, bilateral: Secondary | ICD-10-CM | POA: Diagnosis not present

## 2023-05-01 ENCOUNTER — Other Ambulatory Visit (HOSPITAL_COMMUNITY): Payer: Self-pay

## 2023-06-05 ENCOUNTER — Other Ambulatory Visit (HOSPITAL_COMMUNITY): Payer: Self-pay

## 2023-06-28 ENCOUNTER — Other Ambulatory Visit (HOSPITAL_COMMUNITY): Payer: Self-pay

## 2023-06-28 MED ORDER — AMLODIPINE BESYLATE 5 MG PO TABS
5.0000 mg | ORAL_TABLET | Freq: Every day | ORAL | 1 refills | Status: DC
  Filled 2023-06-28: qty 90, 90d supply, fill #0
  Filled 2023-10-08: qty 90, 90d supply, fill #1

## 2023-06-29 ENCOUNTER — Other Ambulatory Visit (HOSPITAL_COMMUNITY): Payer: Self-pay

## 2023-08-04 ENCOUNTER — Other Ambulatory Visit (HOSPITAL_COMMUNITY): Payer: Self-pay

## 2023-09-04 ENCOUNTER — Other Ambulatory Visit (HOSPITAL_COMMUNITY): Payer: Self-pay

## 2023-10-04 DIAGNOSIS — Z0000 Encounter for general adult medical examination without abnormal findings: Secondary | ICD-10-CM | POA: Diagnosis not present

## 2023-10-04 DIAGNOSIS — Z860100 Personal history of colon polyps, unspecified: Secondary | ICD-10-CM | POA: Diagnosis not present

## 2023-10-04 DIAGNOSIS — E119 Type 2 diabetes mellitus without complications: Secondary | ICD-10-CM | POA: Diagnosis not present

## 2023-10-04 DIAGNOSIS — I10 Essential (primary) hypertension: Secondary | ICD-10-CM | POA: Diagnosis not present

## 2023-10-04 DIAGNOSIS — E785 Hyperlipidemia, unspecified: Secondary | ICD-10-CM | POA: Diagnosis not present

## 2023-10-04 DIAGNOSIS — Z23 Encounter for immunization: Secondary | ICD-10-CM | POA: Diagnosis not present

## 2023-10-04 DIAGNOSIS — Z1159 Encounter for screening for other viral diseases: Secondary | ICD-10-CM | POA: Diagnosis not present

## 2023-10-05 ENCOUNTER — Other Ambulatory Visit (HOSPITAL_COMMUNITY): Payer: Self-pay

## 2023-10-05 ENCOUNTER — Other Ambulatory Visit: Payer: Self-pay

## 2023-10-05 MED ORDER — ROSUVASTATIN CALCIUM 5 MG PO TABS
5.0000 mg | ORAL_TABLET | Freq: Every day | ORAL | 1 refills | Status: DC
  Filled 2023-10-05: qty 90, 90d supply, fill #0
  Filled 2024-01-15: qty 90, 90d supply, fill #1

## 2023-10-05 MED ORDER — AMLODIPINE BESYLATE 5 MG PO TABS
5.0000 mg | ORAL_TABLET | Freq: Every day | ORAL | 1 refills | Status: AC
  Filled 2023-10-05: qty 90, 90d supply, fill #0
  Filled 2024-01-15: qty 90, 90d supply, fill #1

## 2023-10-08 ENCOUNTER — Other Ambulatory Visit (HOSPITAL_COMMUNITY): Payer: Self-pay

## 2024-04-02 ENCOUNTER — Other Ambulatory Visit (HOSPITAL_BASED_OUTPATIENT_CLINIC_OR_DEPARTMENT_OTHER): Payer: Self-pay

## 2024-04-02 DIAGNOSIS — I10 Essential (primary) hypertension: Secondary | ICD-10-CM | POA: Diagnosis not present

## 2024-04-02 DIAGNOSIS — E1169 Type 2 diabetes mellitus with other specified complication: Secondary | ICD-10-CM | POA: Diagnosis not present

## 2024-04-02 DIAGNOSIS — E785 Hyperlipidemia, unspecified: Secondary | ICD-10-CM | POA: Diagnosis not present

## 2024-04-02 DIAGNOSIS — R079 Chest pain, unspecified: Secondary | ICD-10-CM | POA: Diagnosis not present

## 2024-04-02 MED ORDER — ROSUVASTATIN CALCIUM 5 MG PO TABS
5.0000 mg | ORAL_TABLET | Freq: Every day | ORAL | 3 refills | Status: AC
  Filled 2024-04-02 – 2024-04-17 (×2): qty 90, 90d supply, fill #0
  Filled 2024-07-24 – 2024-07-25 (×2): qty 90, 90d supply, fill #1

## 2024-04-02 MED ORDER — AMLODIPINE BESYLATE 5 MG PO TABS
5.0000 mg | ORAL_TABLET | Freq: Every day | ORAL | 3 refills | Status: DC
  Filled 2024-04-02 – 2024-04-19 (×2): qty 90, 90d supply, fill #0

## 2024-04-03 ENCOUNTER — Other Ambulatory Visit (HOSPITAL_COMMUNITY): Payer: Self-pay

## 2024-04-03 ENCOUNTER — Other Ambulatory Visit: Payer: Self-pay

## 2024-04-15 ENCOUNTER — Other Ambulatory Visit (HOSPITAL_COMMUNITY): Payer: Self-pay

## 2024-04-17 ENCOUNTER — Other Ambulatory Visit: Payer: Self-pay

## 2024-04-17 ENCOUNTER — Other Ambulatory Visit (HOSPITAL_COMMUNITY): Payer: Self-pay

## 2024-04-18 ENCOUNTER — Other Ambulatory Visit (HOSPITAL_COMMUNITY): Payer: Self-pay

## 2024-04-19 ENCOUNTER — Other Ambulatory Visit (HOSPITAL_COMMUNITY): Payer: Self-pay

## 2024-04-25 ENCOUNTER — Other Ambulatory Visit (HOSPITAL_COMMUNITY): Payer: Self-pay

## 2024-05-02 DIAGNOSIS — S52501A Unspecified fracture of the lower end of right radius, initial encounter for closed fracture: Secondary | ICD-10-CM | POA: Diagnosis not present

## 2024-05-02 DIAGNOSIS — M25511 Pain in right shoulder: Secondary | ICD-10-CM | POA: Diagnosis not present

## 2024-05-06 DIAGNOSIS — S52501A Unspecified fracture of the lower end of right radius, initial encounter for closed fracture: Secondary | ICD-10-CM | POA: Diagnosis not present

## 2024-05-06 DIAGNOSIS — M25531 Pain in right wrist: Secondary | ICD-10-CM | POA: Diagnosis not present

## 2024-05-10 DIAGNOSIS — S52501A Unspecified fracture of the lower end of right radius, initial encounter for closed fracture: Secondary | ICD-10-CM | POA: Diagnosis not present

## 2024-06-03 ENCOUNTER — Other Ambulatory Visit: Payer: Self-pay | Admitting: Internal Medicine

## 2024-06-03 ENCOUNTER — Ambulatory Visit: Attending: Internal Medicine | Admitting: Internal Medicine

## 2024-06-03 ENCOUNTER — Ambulatory Visit: Attending: Internal Medicine

## 2024-06-03 ENCOUNTER — Encounter: Payer: Self-pay | Admitting: Internal Medicine

## 2024-06-03 VITALS — BP 131/80 | HR 77 | Ht 68.0 in | Wt 192.0 lb

## 2024-06-03 DIAGNOSIS — R002 Palpitations: Secondary | ICD-10-CM

## 2024-06-03 DIAGNOSIS — I10 Essential (primary) hypertension: Secondary | ICD-10-CM | POA: Diagnosis not present

## 2024-06-03 DIAGNOSIS — E782 Mixed hyperlipidemia: Secondary | ICD-10-CM | POA: Diagnosis not present

## 2024-06-03 DIAGNOSIS — R42 Dizziness and giddiness: Secondary | ICD-10-CM | POA: Diagnosis not present

## 2024-06-03 DIAGNOSIS — R55 Syncope and collapse: Secondary | ICD-10-CM | POA: Diagnosis not present

## 2024-06-03 DIAGNOSIS — R079 Chest pain, unspecified: Secondary | ICD-10-CM

## 2024-06-03 DIAGNOSIS — R0609 Other forms of dyspnea: Secondary | ICD-10-CM

## 2024-06-03 NOTE — Patient Instructions (Addendum)
 Medication Instructions:  Your physician recommends that you continue on your current medications as directed. Please refer to the Current Medication list given to you today.  *If you need a refill on your cardiac medications before your next appointment, please call your pharmacy*  Lab Work: None ordered.  You may go to any [facility] Location for your lab work:  [facility] - [address] ([facility]) - [address] [phone] [address]  [place] - [address]   [place]  - [address]   [place] - [address] - [address] [employer]  - [address] [place] [address] ([facility]   If you have labs (blood work) drawn today and your tests are completely normal, you will receive your results only by: [employer] (if you have MyChart)  If you have any lab test that is abnormal or we need to change your treatment, we will call you or send a MyChart message to review the results.  Testing/Procedures: Your physician has requested that you have a lexiscan myoview. For further information please visit [email or web address]   How to Prepare for Your Myoview Test:        A. Nothing to eat or drink 3 hours prior to arrival time, except you may drink water       B. No Caffeine/Decaffeinated products or chocolate 12 hours prior to arrival.       C. No Cologne or Lotion       D. Wear comfortable walking shoes       E. Total time is 3 to 4 hours; you may want to bring reading material for the waiting time. If someone comes with you, they will need to remain in the lobby due to           limited space in the testing area.        F. Please report to [address] for your test.        G. Medication Instructions:   After You Arrive:  Once you arrive in the Nuclear Cardiology lab, an IV will be started, then the Technologist will inject a small  amount of radioactive tracer. There will be a 1 hour waiting period after this injection. A series of pictures will be taken of your heart following this waiting period. You will be prepped for the stress portion of the test. During the stress portion of your test a small amount of radioactive tracer will be injected in your IV. After the stress portion, there is a short rest period during which time your heart and blood pressure will be monitored. After the short test period the Technologist will begin your second set of pictures. Your doctor will inform you of your test results within 7 days.  In preparation for your appointment, medication, and supplies will be purchased. Appointment availability is limited, so if you need to cancel or reschedule please call the Nuclear Department at [phone], 24 hours in advance to avoid a cancellation fee of $50.00   ZIO XT- Long Term Monitor Instructions  Your physician has requested you wear a ZIO patch monitor for 14 days.   This is a single patch monitor. [employer] supplies one patch monitor per enrollment. Additional  stickers are not available. Please do not apply patch if you will be having a Nuclear Stress Test,  Echocardiogram, Cardiac CT, MRI, or Chest Xray during the period you would be wearing the  monitor. The patch cannot be worn during these tests. You cannot remove and re-apply the  ZIO XT patch monitor.   Your ZIO patch monitor will be mailed 3 day USPS to your address on file. It may take 3-5 days  to receive your monitor after you have been enrolled.   Once you have received your monitor, please review the enclosed instructions. Your monitor  has already been registered assigning a specific monitor serial # to you.     Billing and Patient Assistance Program Information  We have supplied [employer] with any of your insurance information on file for billing purposes.  [employer] offers a sliding scale Patient Assistance Program for patients  that do not have  insurance, or whose insurance does not completely cover the cost of the ZIO monitor.  You must apply for the Patient Assistance Program to qualify for this discounted rate.   To apply, please call [employer] at [phone], select option 4, select option 2, ask to apply for  Patient Assistance Program. Meredeth will ask your household income, and how many people  are in your household. They will quote your out-of-pocket cost based on that information.  [employer] will also be able to set up a 39-month, interest-free payment plan if needed.     Applying the monitor  Shave hair from upper left chest.  Hold abrader disc by orange tab. Rub abrader in 40 strokes over the upper left chest as  indicated in your monitor instructions.  Clean area with 4 enclosed alcohol pads. Let dry.  Apply patch as indicated in monitor instructions. Patch will be placed under collarbone on left  side of chest with arrow pointing upward.  Rub patch adhesive wings for 2 minutes. Remove white label marked 1. Remove the white  label marked 2. Rub patch adhesive wings for 2 additional minutes.  While looking in a mirror, press and release button in center of patch. A small green light will  flash 3-4 times. This will be your only indicator that the monitor has been turned on.  Do not shower for the first 24 hours. You may shower after the first 24 hours.  Press the button if you feel a symptom. You will hear a small click. Record Date, Time and  Symptom in the Patient Logbook.  When you are ready to remove the patch, follow instructions on the last 2 pages of Patient  Logbook. Stick patch monitor onto the last page of Patient Logbook.   Place Patient Logbook in the blue and white box. Use locking tab on box and tape box closed  securely. The blue and white box has prepaid postage on it. Please place it in the mailbox as  soon as possible. Your physician should have your test results approximately 7  days after the  monitor has been mailed back to [facility].   Call [facility] Customer Care at [phone] if you have questions regarding  your ZIO XT patch monitor. Call them immediately if you see an orange light blinking on your  monitor.   If your monitor falls off in less than 4 days, contact our Monitor department at [phone].   If your monitor becomes loose or falls off after 4 days call [employer] at [phone] for  suggestions on securing your monitor.     Follow-Up: At [facility], you and your health needs are our priority.  As part of  our continuing mission to provide you with exceptional heart care, we have created designated Provider Care Teams.  These Care Teams include your primary Cardiologist (physician) and Advanced Practice Providers (APPs -  Physician Assistants and Nurse Practitioners) who all work together to provide you with the care you need, when you need it.  Your next appointment:   1 year(s)  The format for your next appointment:   In Person  Provider:   Emeline Calender, DO

## 2024-06-03 NOTE — Progress Notes (Signed)
 Cardiology Office Note   Date:  06/03/2024  ID:  Ghina Betker, DOB [birth year], MRN [id number] PCP: Sun, Vyvyan, MD  [facility] Providers Cardiologist:  Emeline FORBES Calender, MD     History of Present Illness Bianca Robinson is a 63 y.o. female whose husband is Dr. Gatha Gatha with oncology, daughter is an oncology fellow at [employer] and son is an Ortho resident in [place]  with a past medical history of diabetes, hypertension, hyperlipidemia who was referred by her PCP for chest pain and has other nonspecific complaints.  Regarding chest pain: She has had 3 episodes of substernal chest pain/burning, 1 occurred on exertion and the others while simply standing/rest.  On 1 or 2 of the occasion she did have radiation to the ear and down the left arm.  This is not reproducible with every time that she exerts herself.  She does however state that she has exertional shortness of breath and so has been limiting her activity level due to fear of her chest discomfort and shortness of breath.  These episodes recurrent over the past 1 year  Regarding dizziness: Patient has had occasional dizziness when going from sitting to standing as and when she has parade recently.  She also was standing in her bathroom when she had sudden onset dizziness and fell, subsequently breaking her right arm and hitting her head.  She did not syncopize.  She did not have chest pain during that episode.  Regarding palpitations: These occur occasionally while at night only and do not occur during the day or while exerting herself.  She has not had the syncopal/presyncopal symptoms during these events.  Tobacco use: No Alcohol use: No Activity level: Prior to the above symptoms, she would exercise frequently Pertinent family history: None    ROS:  Review of Systems  All other systems reviewed and are negative.   Physical Exam  Physical Exam Vitals and nursing note reviewed.  Constitutional:      Appearance: Normal  appearance.  HENT:     Head: Normocephalic and atraumatic.  Eyes:     Conjunctiva/sclera: Conjunctivae normal.  Neck:     Vascular: No carotid bruit.  Cardiovascular:     Rate and Rhythm: Normal rate and regular rhythm.  Pulmonary:     Effort: Pulmonary effort is normal.     Breath sounds: Normal breath sounds.  Musculoskeletal:        General: No swelling or tenderness.  Skin:    Coloration: Skin is not jaundiced or pale.  Neurological:     Mental Status: She is alert.     VS:  BP 131/80   Pulse 77   Ht 5' 8 (1.727 m)   Wt 192 lb (87.1 kg)   SpO2 99%   BMI 29.19 kg/m         Wt Readings from Last 3 Encounters:  06/03/24 192 lb (87.1 kg)  08/18/17 161 lb (73 kg)  08/08/17 161 lb (73 kg)     EKG Interpretation Date/Time:  Monday June 03 2024 14:27:12 EDT Ventricular Rate:  86 PR Interval:  168 QRS Duration:  80 QT Interval:  366 QTC Calculation: 437 R Axis:   30  Text Interpretation: Normal sinus rhythm Nonspecific T wave abnormality Possible Left atrial enlargement Possible Anteroseptal infarct When compared with ECG of 17-Apr-2021 14:36, No significant change since Confirmed by Calender Emeline [phone]) on 06/03/2024 2:30:35 PM    Studies Reviewed        Risk Assessment/Calculations  ASSESSMENT  Substernal chest pain has typical and atypical features including an episode that occurred with exertion and has had radiation to the neck and jaw however also had discomfort at rest.  Does not occur with every exertional episode. Palpitations Presyncope orthostatics negative Dyspnea on Exertion Hypertension BP 128/88 on amlodipine  5 mg Hyperlipidemia ASCVD: 11.5% lipid panel 04/02/2024 from outside: Cholesterol 183, triglycerides 80, HDL 80, LDL 88.  On rosuvastatin  5 mg Type 2 diabetes   Plan  Echocardiogram Nuclear stress test  2-week event monitor Cardiac risk counseling and prevention recommendations: Heart healthy/Mediterranean  diet with whole grains, fruits, vegetable, fish, lean meats, nuts, and olive oil. Limit salt. Moderate walking, 3-5 times/week for 30-50 minutes each session. Aim for at least 150 minutes.week. Goal should be pace of 3 miles/hour, or walking 1.5 miles in 30 minutes Avoidance of tobacco products. Avoid excess alcohol.  Follow up: 4 to 6 weeks     Informed Consent   Shared Decision Making/Informed Consent The risks [chest pain, shortness of breath, cardiac arrhythmias, dizziness, blood pressure fluctuations, myocardial infarction, stroke/transient ischemic attack, nausea, vomiting, allergic reaction, radiation exposure, metallic taste sensation and life-threatening complications (estimated to be 1 in 10,000)], benefits (risk stratification, diagnosing coronary artery disease, treatment guidance) and alternatives of a nuclear stress test were discussed in detail with Bianca Robinson and she agrees to proceed.       Signed, Emeline FORBES Calender, MD

## 2024-06-03 NOTE — Progress Notes (Unsigned)
 Enrolled for [employer] to mail a ZIO XT long term holter monitor to the patients address on file.

## 2024-06-05 DIAGNOSIS — S52501A Unspecified fracture of the lower end of right radius, initial encounter for closed fracture: Secondary | ICD-10-CM | POA: Diagnosis not present

## 2024-06-12 DIAGNOSIS — M25631 Stiffness of right wrist, not elsewhere classified: Secondary | ICD-10-CM | POA: Diagnosis not present

## 2024-06-12 DIAGNOSIS — M25531 Pain in right wrist: Secondary | ICD-10-CM | POA: Diagnosis not present

## 2024-06-25 DIAGNOSIS — M25631 Stiffness of right wrist, not elsewhere classified: Secondary | ICD-10-CM | POA: Diagnosis not present

## 2024-06-25 DIAGNOSIS — M25531 Pain in right wrist: Secondary | ICD-10-CM | POA: Diagnosis not present

## 2024-07-02 ENCOUNTER — Telehealth (HOSPITAL_COMMUNITY): Payer: Self-pay | Admitting: *Deleted

## 2024-07-02 DIAGNOSIS — M25531 Pain in right wrist: Secondary | ICD-10-CM | POA: Diagnosis not present

## 2024-07-02 DIAGNOSIS — M25631 Stiffness of right wrist, not elsewhere classified: Secondary | ICD-10-CM | POA: Diagnosis not present

## 2024-07-02 NOTE — Telephone Encounter (Signed)
 Left message on voicemail in reference to upcoming appointment scheduled for  07/10/24 Phone number given for a call back so details instructions can be given.  Claudene Ronal Quale, RN

## 2024-07-03 ENCOUNTER — Other Ambulatory Visit: Payer: Self-pay | Admitting: Internal Medicine

## 2024-07-03 DIAGNOSIS — R079 Chest pain, unspecified: Secondary | ICD-10-CM

## 2024-07-08 ENCOUNTER — Telehealth (HOSPITAL_COMMUNITY): Payer: Self-pay | Admitting: *Deleted

## 2024-07-08 NOTE — Telephone Encounter (Signed)
 Left message on voicemail in reference to upcoming appointment scheduled for  07/10/24 Phone number given for a call back so details instructions can be given.  Claudene Ronal Quale, RN

## 2024-07-09 DIAGNOSIS — M25531 Pain in right wrist: Secondary | ICD-10-CM | POA: Diagnosis not present

## 2024-07-09 DIAGNOSIS — M25631 Stiffness of right wrist, not elsewhere classified: Secondary | ICD-10-CM | POA: Diagnosis not present

## 2024-07-10 ENCOUNTER — Ambulatory Visit (HOSPITAL_COMMUNITY)
Admission: RE | Admit: 2024-07-10 | Discharge: 2024-07-10 | Disposition: A | Discharge status: Home / Self Care | Source: Ambulatory Visit | Attending: Cardiology | Admitting: Cardiology

## 2024-07-10 ENCOUNTER — Ambulatory Visit (HOSPITAL_BASED_OUTPATIENT_CLINIC_OR_DEPARTMENT_OTHER)
Admission: RE | Admit: 2024-07-10 | Discharge: 2024-07-10 | Disposition: A | Discharge status: Home / Self Care | Source: Ambulatory Visit | Attending: Internal Medicine | Admitting: Internal Medicine

## 2024-07-10 DIAGNOSIS — M47814 Spondylosis without myelopathy or radiculopathy, thoracic region: Secondary | ICD-10-CM | POA: Diagnosis not present

## 2024-07-10 DIAGNOSIS — R079 Chest pain, unspecified: Secondary | ICD-10-CM | POA: Diagnosis not present

## 2024-07-10 DIAGNOSIS — K449 Diaphragmatic hernia without obstruction or gangrene: Secondary | ICD-10-CM | POA: Diagnosis not present

## 2024-07-10 LAB — MYOCARDIAL PERFUSION IMAGING
Angina Index: 0
Base ST Depression (mm): 0 mm
Duke Treadmill Score: 6
Estimated workload: 7
Exercise duration (min): 6 min
Exercise duration (sec): 0 s
LV dias vol: 79 mL (ref 46–106)
LV sys vol: 29 mL (ref 3.8–5.2)
MPHR: 155 {beats}/min
Nuc Stress EF: 63 %
Peak HR: 137 {beats}/min
Percent HR: 88 %
Rest HR: 76 {beats}/min
Rest Nuclear Isotope Dose: 10.9 mCi
SDS: 0
SRS: 3
SSS: 0
ST Depression (mm): 0 mm
Stress Nuclear Isotope Dose: 32.8 mCi
TID: 1.08

## 2024-07-10 LAB — ECHOCARDIOGRAM COMPLETE
Area-P 1/2: 3.86 cm2
S' Lateral: 2.7 cm

## 2024-07-10 MED ORDER — TECHNETIUM TC 99M TETROFOSMIN IV KIT
10.9000 | PACK | Freq: Once | INTRAVENOUS | Status: AC | PRN
  Administered 2024-07-10: 10.9 via INTRAVENOUS

## 2024-07-10 MED ORDER — TECHNETIUM TC 99M TETROFOSMIN IV KIT
32.8000 | PACK | Freq: Once | INTRAVENOUS | Status: AC | PRN
  Administered 2024-07-10: 32.8 via INTRAVENOUS

## 2024-07-11 ENCOUNTER — Ambulatory Visit: Payer: Self-pay | Admitting: Internal Medicine

## 2024-07-16 DIAGNOSIS — R002 Palpitations: Secondary | ICD-10-CM | POA: Diagnosis not present

## 2024-07-16 DIAGNOSIS — R42 Dizziness and giddiness: Secondary | ICD-10-CM | POA: Diagnosis not present

## 2024-07-17 ENCOUNTER — Ambulatory Visit: Payer: Self-pay | Admitting: Internal Medicine

## 2024-07-17 DIAGNOSIS — S52501A Unspecified fracture of the lower end of right radius, initial encounter for closed fracture: Secondary | ICD-10-CM | POA: Diagnosis not present

## 2024-07-17 DIAGNOSIS — R079 Chest pain, unspecified: Secondary | ICD-10-CM | POA: Diagnosis not present

## 2024-07-17 DIAGNOSIS — R55 Syncope and collapse: Secondary | ICD-10-CM | POA: Diagnosis not present

## 2024-07-17 DIAGNOSIS — R002 Palpitations: Secondary | ICD-10-CM

## 2024-07-17 DIAGNOSIS — R42 Dizziness and giddiness: Secondary | ICD-10-CM | POA: Diagnosis not present

## 2024-07-17 DIAGNOSIS — M25631 Stiffness of right wrist, not elsewhere classified: Secondary | ICD-10-CM | POA: Diagnosis not present

## 2024-07-17 DIAGNOSIS — M25531 Pain in right wrist: Secondary | ICD-10-CM | POA: Diagnosis not present

## 2024-07-23 DIAGNOSIS — M25631 Stiffness of right wrist, not elsewhere classified: Secondary | ICD-10-CM | POA: Diagnosis not present

## 2024-07-23 DIAGNOSIS — M25531 Pain in right wrist: Secondary | ICD-10-CM | POA: Diagnosis not present

## 2024-07-25 ENCOUNTER — Other Ambulatory Visit (HOSPITAL_COMMUNITY): Payer: Self-pay

## 2024-07-25 MED ORDER — AMLODIPINE BESYLATE 5 MG PO TABS
5.0000 mg | ORAL_TABLET | Freq: Every day | ORAL | 2 refills | Status: AC
  Filled 2024-07-25: qty 90, 90d supply, fill #0

## 2024-07-30 DIAGNOSIS — M25631 Stiffness of right wrist, not elsewhere classified: Secondary | ICD-10-CM | POA: Diagnosis not present

## 2024-07-30 DIAGNOSIS — M25531 Pain in right wrist: Secondary | ICD-10-CM | POA: Diagnosis not present

## 2024-08-16 DIAGNOSIS — H43391 Other vitreous opacities, right eye: Secondary | ICD-10-CM | POA: Diagnosis not present

## 2024-08-16 DIAGNOSIS — E119 Type 2 diabetes mellitus without complications: Secondary | ICD-10-CM | POA: Diagnosis not present

## 2024-08-16 DIAGNOSIS — H5203 Hypermetropia, bilateral: Secondary | ICD-10-CM | POA: Diagnosis not present
